# Patient Record
Sex: Female | Born: 1980 | Race: Black or African American | Hispanic: No | Marital: Married | Smoking: Never smoker
Health system: Southern US, Community
[De-identification: ages and names within clinical notes are randomized; demographics above are authoritative.]

## PROBLEM LIST (undated history)

## (undated) DIAGNOSIS — D649 Anemia, unspecified: Secondary | ICD-10-CM

## (undated) DIAGNOSIS — D219 Benign neoplasm of connective and other soft tissue, unspecified: Secondary | ICD-10-CM

## (undated) HISTORY — DX: Anemia, unspecified: D64.9

## (undated) HISTORY — DX: Benign neoplasm of connective and other soft tissue, unspecified: D21.9

---

## 2016-12-14 ENCOUNTER — Encounter: Payer: Self-pay | Admitting: Obstetrics and Gynecology

## 2016-12-14 ENCOUNTER — Ambulatory Visit (INDEPENDENT_AMBULATORY_CARE_PROVIDER_SITE_OTHER): Payer: Medicaid Other | Admitting: Obstetrics and Gynecology

## 2016-12-14 VITALS — BP 119/75 | HR 103 | Ht 63.0 in | Wt 172.4 lb

## 2016-12-14 DIAGNOSIS — Z3009 Encounter for other general counseling and advice on contraception: Secondary | ICD-10-CM

## 2016-12-14 NOTE — Progress Notes (Signed)
HPI:      [name] is a 36 y.o. [phone] who LMP was Patient's last menstrual period was 12/08/2016 (exact date).  Subjective:   She presents today Because she has just moved to this area and is about to run out of OCPs. She is also on a generic birth control pill that she is unhappy with. She states that it has decreased her sex drive and she says she just "doesn't feel right"on them.    Hx: The following portions of the patient's history were reviewed and updated as appropriate:              She  has a past medical history of Fibroid. She  does not have a problem list on file. She  has a past surgical history that includes Cesarean section. Her family history includes Diabetes in her mother. She  reports that she has never smoked. She has never used smokeless tobacco. She reports that she drinks alcohol. She reports that she does not use drugs. No current outpatient prescriptions on file prior to visit.   No current facility-administered medications on file prior to visit.          Review of Systems:  Review of Systems  Constitutional: Denied constitutional symptoms, night sweats, recent illness, fatigue, fever, insomnia and weight loss.  Eyes: Denied eye symptoms, eye pain, photophobia, vision change and visual disturbance.  Ears/Nose/Throat/Neck: Denied ear, nose, throat or neck symptoms, hearing loss, nasal discharge, sinus congestion and sore throat.  Cardiovascular: Denied cardiovascular symptoms, arrhythmia, chest pain/pressure, edema, exercise intolerance, orthopnea and palpitations.  Respiratory: Denied pulmonary symptoms, asthma, pleuritic pain, productive sputum, cough, dyspnea and wheezing.  Gastrointestinal: Denied, gastro-esophageal reflux, melena, nausea and vomiting.  Genitourinary: Denied genitourinary symptoms including symptomatic vaginal discharge, pelvic relaxation issues, and urinary complaints.  Musculoskeletal: Denied musculoskeletal symptoms,  stiffness, swelling, muscle weakness and myalgia.  Dermatologic: Denied dermatology symptoms, rash and scar.  Neurologic: Denied neurology symptoms, dizziness, headache, neck pain and syncope.  Psychiatric: Denied psychiatric symptoms, anxiety and depression.  Endocrine: Denied endocrine symptoms including hot flashes and night sweats.   Meds:   No current outpatient prescriptions on file prior to visit.   No current facility-administered medications on file prior to visit.     Objective:     Vitals:   12/14/16 1536  BP: 119/75  Pulse: (!) 103                Assessment:    [id number] There are no active problems to display for this patient.    1. Birth control counseling     We have discussed multiple forms of birth control pills including both estrogenic and progestogen pills and advantages and disadvantages of each. She would like to try a generic form of 1/20 Fe pill.   Plan:            1.  Change OCPs to generic form of 1/20 pill.  2.  Return for annual exam and Pap smear within 2 weeks.   F/U  Return in about 2 weeks (around 12/28/2016). I spent 22 minutes with this patient of which greater than 50% was spent discussing different types of OCPs, the advantages and disadvantages, necessary annual follow-up for exam and Pap smear.  Finis Bud, M.D. 12/14/2016 4:41 PM

## 2016-12-21 ENCOUNTER — Encounter: Payer: Medicaid Other | Admitting: Obstetrics and Gynecology

## 2016-12-27 ENCOUNTER — Encounter: Payer: Self-pay | Admitting: Obstetrics and Gynecology

## 2016-12-27 ENCOUNTER — Ambulatory Visit (INDEPENDENT_AMBULATORY_CARE_PROVIDER_SITE_OTHER): Payer: Medicaid Other | Admitting: Obstetrics and Gynecology

## 2016-12-27 VITALS — BP 103/73 | HR 89 | Ht 63.0 in | Wt 171.3 lb

## 2016-12-27 DIAGNOSIS — Z0000 Encounter for general adult medical examination without abnormal findings: Secondary | ICD-10-CM | POA: Diagnosis not present

## 2016-12-27 MED ORDER — NORETHINDRONE ACET-ETHINYL EST 1-20 MG-MCG PO TABS: 1.0000 | ORAL_TABLET | Freq: Every day | ORAL | 11 refills | Status: DC

## 2016-12-27 NOTE — Progress Notes (Signed)
HPI:      [name] is a 36 y.o. [phone] who LMP was Patient's last menstrual period was 12/08/2016 (exact date).  Subjective:   She presents today for her annual examination.  She has started her new pills but is not sure if they're working to change her sex drive yet.    Hx: The following portions of the patient's history were reviewed and updated as appropriate:              She  has a past medical history of Fibroid. She  does not have a problem list on file. She  has a past surgical history that includes Cesarean section. Her family history includes Diabetes in her mother. She  reports that she has never smoked. She has never used smokeless tobacco. She reports that she drinks alcohol. She reports that she does not use drugs. No current outpatient prescriptions on file prior to visit.   No current facility-administered medications on file prior to visit.          Review of Systems:  Review of Systems  Constitutional: Denied constitutional symptoms, night sweats, recent illness, fatigue, fever, insomnia and weight loss.  Eyes: Denied eye symptoms, eye pain, photophobia, vision change and visual disturbance.  Ears/Nose/Throat/Neck: Denied ear, nose, throat or neck symptoms, hearing loss, nasal discharge, sinus congestion and sore throat.  Cardiovascular: Denied cardiovascular symptoms, arrhythmia, chest pain/pressure, edema, exercise intolerance, orthopnea and palpitations.  Respiratory: Denied pulmonary symptoms, asthma, pleuritic pain, productive sputum, cough, dyspnea and wheezing.  Gastrointestinal: Denied, gastro-esophageal reflux, melena, nausea and vomiting.  Genitourinary: Denied genitourinary symptoms including symptomatic vaginal discharge, pelvic relaxation issues, and urinary complaints.  Musculoskeletal: Denied musculoskeletal symptoms, stiffness, swelling, muscle weakness and myalgia.  Dermatologic: Denied dermatology symptoms, rash and scar.  Neurologic:  Denied neurology symptoms, dizziness, headache, neck pain and syncope.  Psychiatric: Denied psychiatric symptoms, anxiety and depression.  Endocrine: Denied endocrine symptoms including hot flashes and night sweats.   Meds:   No current outpatient prescriptions on file prior to visit.   No current facility-administered medications on file prior to visit.     Objective:     Vitals:   12/27/16 0810  BP: 103/73  Pulse: 89              Physical examination General NAD, Conversant  HEENT Atraumatic; Op clear with mmm.  Normo-cephalic. Pupils reactive. Anicteric sclerae  Thyroid/Neck Smooth without nodularity or enlargement. Normal ROM.  Neck Supple.  Skin No rashes, lesions or ulceration. Normal palpated skin turgor. No nodularity.  Breasts: No masses or discharge.  Symmetric.  No axillary adenopathy.  Lungs: Clear to auscultation.No rales or wheezes. Normal Respiratory effort, no retractions.  Heart: NSR.  No murmurs or rubs appreciated. No periferal edema  Abdomen: Soft.  Non-tender.  No masses.  No HSM. No hernia  Extremities: Moves all appropriately.  Normal ROM for age. No lymphadenopathy.  Neuro: Oriented to PPT.  Normal mood. Normal affect.     Pelvic:   Vulva: Normal appearance.  No lesions.  Vagina: No lesions or abnormalities noted.  Support: Normal pelvic support.  Urethra No masses tenderness or scarring.  Meatus Normal size without lesions or prolapse.  Cervix: Normal appearance.  No lesions. Located very far anterior.   Anus: Normal exam.  No lesions.  Perineum: Normal exam.  No lesions.        Bimanual   Uterus: Normal size.  Non-tender.  Mobile.  AV.  Mid position small  Adnexae: No masses.  Non-tender to palpation.  Cul-de-sac: Negative for abnormality.      Assessment:    [id number] There are no active problems to display for this patient.    1. Encounter for annual physical exam     Normal exam, OCPs without problem   Plan:            1.   Basic Screening Recommendations The basic screening recommendations for asymptomatic women were discussed with the patient during her visit.  The age-appropriate recommendations were discussed with her and the rational for the tests reviewed.  When I am informed by the patient that another primary care physician has previously obtained the age-appropriate tests and they are up-to-date, only outstanding tests are ordered and referrals given as necessary.  Abnormal results of tests will be discussed with her when all of her results are completed. Pap performed     Meds ordered this encounter  Medications  . DISCONTD: norgestrel-ethinyl estradiol (LO/OVRAL,CRYSELLE) 0.3-30 MG-MCG tablet    Sig: Take 1 tablet by mouth daily.  . norethindrone-ethinyl estradiol (MICROGESTIN) 1-20 MG-MCG tablet    Sig: Take 1 tablet by mouth daily.    Dispense:  1 Package    Refill:  11        F/U  Return in about 1 year (around 12/27/2017) for Annual Physical.  Finis Bud, M.D. 12/27/2016 8:47 AM

## 2016-12-27 NOTE — Addendum Note (Signed)
Addended by: Raliegh Ip on: 12/27/2016 09:01 AM   Modules accepted: Orders

## 2016-12-29 ENCOUNTER — Telehealth: Payer: Self-pay

## 2016-12-29 LAB — PAP IG AND HPV HIGH-RISK
HPV, HIGH-RISK: NEGATIVE
PAP Smear Comment: 0

## 2016-12-29 NOTE — Telephone Encounter (Signed)
-----   Message from Harlin Heys, MD sent at 12/29/2016  2:53 PM EDT ----- Negative Pap and HPV

## 2016-12-29 NOTE — Telephone Encounter (Signed)
Informed pt of neg test results per provider.

## 2017-04-22 ENCOUNTER — Telehealth: Payer: Self-pay | Admitting: Obstetrics and Gynecology

## 2017-04-22 NOTE — Telephone Encounter (Signed)
Patient called back to speak with you, informed you were at lunch. She would like a call back. Thanks

## 2017-04-22 NOTE — Telephone Encounter (Signed)
Pt states she has been bleeding since you saw her in March and prescribed Junell for birth control. States she has used "3 or 4 " packets. States this bleeding is very inconvenient and she would like to have the pill she has taken in the past- microgestin C 1/20 and she would like that sent to the pharmacy before Monday 04/25/17. Please advise. Thanks

## 2017-04-22 NOTE — Telephone Encounter (Signed)
Patient called to speak with Dr. Amalia Hailey. She is concerned because her birth control is not working, and she would like another/different prescription before Monday. Patient was informed that she will get a call back within 24 hrs. Patient did not disclose any other information.

## 2017-04-22 NOTE — Telephone Encounter (Signed)
Attempted to return pts call- unable to leave message states pt is unavailable please try your call later.

## 2017-04-25 NOTE — Telephone Encounter (Signed)
Scheduled patient for 04/26/2017 @ 0800am per Ivin Booty to schedule patient.

## 2017-04-26 ENCOUNTER — Encounter: Payer: Medicaid Other | Admitting: Obstetrics and Gynecology

## 2017-04-27 ENCOUNTER — Ambulatory Visit (INDEPENDENT_AMBULATORY_CARE_PROVIDER_SITE_OTHER): Payer: Medicaid Other | Admitting: Obstetrics and Gynecology

## 2017-04-27 VITALS — BP 105/70 | HR 89 | Ht 63.0 in | Wt 170.1 lb

## 2017-04-27 DIAGNOSIS — Z789 Other specified health status: Secondary | ICD-10-CM

## 2017-04-27 DIAGNOSIS — Z3041 Encounter for surveillance of contraceptive pills: Secondary | ICD-10-CM | POA: Diagnosis not present

## 2017-04-28 ENCOUNTER — Telehealth: Payer: Self-pay

## 2017-04-28 ENCOUNTER — Encounter: Payer: Self-pay | Admitting: Obstetrics and Gynecology

## 2017-04-28 NOTE — Progress Notes (Signed)
HPI:      [name] is a 36 y.o. [phone] who LMP was No LMP recorded.  Subjective:   She presents today With complaints regarding her birth control pills. We initially changed her OCPs at her request to the exact formulation she requested. Unfortunately her pharmacy gave her a generic. This was explained to her she called all pharmacies and the area and none of them have the exact pill she desires. She believes that the generic form of the pill is causing her problems which include breakthrough bleeding and occasional diarrhea. She is requesting a change in OCPs and requesting that before she leaves the office she has a chance to research them call the pharmacy's in the area and find out who carries them and make sure they are acceptable to her.    Hx: The following portions of the patient's history were reviewed and updated as appropriate:             She  has a past medical history of Fibroid. She  does not have a problem list on file. She  has a past surgical history that includes Cesarean section. Her family history includes Diabetes in her mother. She  reports that she has never smoked. She has never used smokeless tobacco. She reports that she drinks alcohol. She reports that she does not use drugs. She has no allergies on file.       Review of Systems:  Review of Systems  Constitutional: Denied constitutional symptoms, night sweats, recent illness, fatigue, fever, insomnia and weight loss.  Eyes: Denied eye symptoms, eye pain, photophobia, vision change and visual disturbance.  Ears/Nose/Throat/Neck: Denied ear, nose, throat or neck symptoms, hearing loss, nasal discharge, sinus congestion and sore throat.  Cardiovascular: Denied cardiovascular symptoms, arrhythmia, chest pain/pressure, edema, exercise intolerance, orthopnea and palpitations.  Respiratory: Denied pulmonary symptoms, asthma, pleuritic pain, productive sputum, cough, dyspnea and wheezing.  Gastrointestinal:  Denied, gastro-esophageal reflux, melena, nausea and vomiting.  Genitourinary: Denied genitourinary symptoms including symptomatic vaginal discharge, pelvic relaxation issues, and urinary complaints.  Musculoskeletal: Denied musculoskeletal symptoms, stiffness, swelling, muscle weakness and myalgia.  Dermatologic: Denied dermatology symptoms, rash and scar.  Neurologic: Denied neurology symptoms, dizziness, headache, neck pain and syncope.  Psychiatric: Denied psychiatric symptoms, anxiety and depression.  Endocrine: Denied endocrine symptoms including hot flashes and night sweats.   Meds:   No current outpatient prescriptions on file prior to visit.   No current facility-administered medications on file prior to visit.     Objective:     There were no vitals filed for this visit.              Assessment:    [id number] There are no active problems to display for this patient.    1. Oral contraceptive intolerance     Patient has numerous complaints regarding her generic form of OCP. She believes it is causing her numerous side effect type problems. We have discussed these problems in detail and have agreed to change her to a different formulation to try to solve her bleeding problems.   Plan:            1.  I have suggested and explained the difference in multiple different brands and formulations of birth control pills. We have decided on a progesterone dominant pill-Loestrin 1.530 patient was instructed very specifically on how to begin the pills. She looked them up on her phone and said they were acceptable to her.  Because our computers were down  we have called them in to her pharmacy directly. Orders No orders of the defined types were placed in this encounter.   No orders of the defined types were placed in this encounter.       F/U  Return for As scheduled. I spent 18 minutes with this patient of which greater than 50% was spent discussing OCPs  Finis Bud,  M.D. 04/28/2017 9:16 AM  ADDEND: Patient has previously been made aware and was made aware again today of the contraindication of OCPs and tobacco use. If she has not discontinued cigarettes at the expiration of her next 3 months of OCPs we will not continue or restart them.

## 2017-04-28 NOTE — Telephone Encounter (Signed)
After speaking with provider - the conversation the pt had with the provider on 04/27/17 the pt requested OCPs to control menstrual cycle. The Fe in the OCP is to help replace blood loss during menses. Explained to pt OrthoTri Cyclen will not help with control. Pt was given option of not taking the Fe but making sure she counted out the days before starting a new pack. Pt expressed understanding and will pick up script that was phoned in 04/27/17.

## 2017-11-22 DIAGNOSIS — Z30013 Encounter for initial prescription of injectable contraceptive: Secondary | ICD-10-CM | POA: Diagnosis not present

## 2017-11-22 DIAGNOSIS — Z309 Encounter for contraceptive management, unspecified: Secondary | ICD-10-CM | POA: Diagnosis not present

## 2018-02-09 DIAGNOSIS — Z30013 Encounter for initial prescription of injectable contraceptive: Secondary | ICD-10-CM | POA: Diagnosis not present

## 2018-02-09 DIAGNOSIS — Z309 Encounter for contraceptive management, unspecified: Secondary | ICD-10-CM | POA: Diagnosis not present

## 2018-03-10 ENCOUNTER — Ambulatory Visit (INDEPENDENT_AMBULATORY_CARE_PROVIDER_SITE_OTHER): Payer: 59 | Admitting: Physician Assistant

## 2018-03-10 ENCOUNTER — Encounter: Payer: Self-pay | Admitting: Physician Assistant

## 2018-03-10 VITALS — BP 108/66 | Temp 98.6°F | Resp 16 | Ht 63.0 in | Wt 190.0 lb

## 2018-03-10 DIAGNOSIS — Z130 Encounter for screening for diseases of the blood and blood-forming organs and certain disorders involving the immune mechanism: Secondary | ICD-10-CM

## 2018-03-10 DIAGNOSIS — Z1329 Encounter for screening for other suspected endocrine disorder: Secondary | ICD-10-CM | POA: Diagnosis not present

## 2018-03-10 DIAGNOSIS — K2970 Gastritis, unspecified, without bleeding: Secondary | ICD-10-CM | POA: Insufficient documentation

## 2018-03-10 DIAGNOSIS — Z0000 Encounter for general adult medical examination without abnormal findings: Secondary | ICD-10-CM

## 2018-03-10 DIAGNOSIS — Z13220 Encounter for screening for lipoid disorders: Secondary | ICD-10-CM | POA: Diagnosis not present

## 2018-03-10 DIAGNOSIS — Z86018 Personal history of other benign neoplasm: Secondary | ICD-10-CM

## 2018-03-10 DIAGNOSIS — H43393 Other vitreous opacities, bilateral: Secondary | ICD-10-CM

## 2018-03-10 DIAGNOSIS — Z114 Encounter for screening for human immunodeficiency virus [HIV]: Secondary | ICD-10-CM | POA: Diagnosis not present

## 2018-03-10 DIAGNOSIS — Z13228 Encounter for screening for other metabolic disorders: Secondary | ICD-10-CM | POA: Diagnosis not present

## 2018-03-10 DIAGNOSIS — D649 Anemia, unspecified: Secondary | ICD-10-CM | POA: Diagnosis not present

## 2018-03-10 LAB — POCT URINALYSIS DIP (MANUAL ENTRY)
Bilirubin, UA: NEGATIVE
Glucose, UA: NEGATIVE mg/dL
Leukocytes, UA: NEGATIVE
Nitrite, UA: NEGATIVE
Protein Ur, POC: NEGATIVE mg/dL
Spec Grav, UA: >=1.03 — AB (ref 1.010–1.025)
Urobilinogen, UA: 0.2 U/dL
pH, UA: 5.5 (ref 5.0–8.0)

## 2018-03-10 NOTE — Patient Instructions (Addendum)
You will get a phone call to schedule an appointment with an eye doctor and with GYN.   Please make an appointment to have your routine teeth cleaning.    Health Maintenance, Female Adopting a healthy lifestyle and getting preventive care can go a long way to promote health and wellness. Talk with your health care provider about what schedule of regular examinations is right for you. This is a good chance for you to check in with your provider about disease prevention and staying healthy. In between checkups, there are plenty of things you can do on your own. Experts have done a lot of research about which lifestyle changes and preventive measures are most likely to keep you healthy. Ask your health care provider for more information. Weight and diet Eat a healthy diet  Be sure to include plenty of vegetables, fruits, low-fat dairy products, and lean protein.  Do not eat a lot of foods high in solid fats, added sugars, or salt.  Get regular exercise. This is one of the most important things you can do for your health. ? Most adults should exercise for at least 150 minutes each week. The exercise should increase your heart rate and make you sweat (moderate-intensity exercise). ? Most adults should also do strengthening exercises at least twice a week. This is in addition to the moderate-intensity exercise.  Maintain a healthy weight  Body mass index (BMI) is a measurement that can be used to identify possible weight problems. It estimates body fat based on height and weight. Your health care provider can help determine your BMI and help you achieve or maintain a healthy weight.  For females 31 years of age and older: ? A BMI below 18.5 is considered underweight. ? A BMI of 18.5 to 24.9 is normal. ? A BMI of 25 to 29.9 is considered overweight. ? A BMI of 30 and above is considered obese.  Watch levels of cholesterol and blood lipids  You should start having your blood tested for  lipids and cholesterol at 37 years of age, then have this test every 5 years.  You may need to have your cholesterol levels checked more often if: ? Your lipid or cholesterol levels are high. ? You are older than 37 years of age. ? You are at high risk for heart disease.  Cancer screening Lung Cancer  Lung cancer screening is recommended for adults 11-70 years old who are at high risk for lung cancer because of a history of smoking.  A yearly low-dose CT scan of the lungs is recommended for people who: ? Currently smoke. ? Have quit within the past 15 years. ? Have at least a 30-pack-year history of smoking. A pack year is smoking an average of one pack of cigarettes a day for 1 year.  Yearly screening should continue until it has been 15 years since you quit.  Yearly screening should stop if you develop a health problem that would prevent you from having lung cancer treatment.  Breast Cancer  Practice breast self-awareness. This means understanding how your breasts normally appear and feel.  It also means doing regular breast self-exams. Let your health care provider know about any changes, no matter how small.  If you are in your 20s or 30s, you should have a clinical breast exam (CBE) by a health care provider every 1-3 years as part of a regular health exam.  If you are 93 or older, have a CBE every year. Also consider having  a breast X-ray (mammogram) every year.  If you have a family history of breast cancer, talk to your health care provider about genetic screening.  If you are at high risk for breast cancer, talk to your health care provider about having an MRI and a mammogram every year.  Breast cancer gene (BRCA) assessment is recommended for women who have family members with BRCA-related cancers. BRCA-related cancers include: ? Breast. ? Ovarian. ? Tubal. ? Peritoneal cancers.  Results of the assessment will determine the need for genetic counseling and BRCA1 and  BRCA2 testing.  Cervical Cancer Your health care provider may recommend that you be screened regularly for cancer of the pelvic organs (ovaries, uterus, and vagina). This screening involves a pelvic examination, including checking for microscopic changes to the surface of your cervix (Pap test). You may be encouraged to have this screening done every 3 years, beginning at age 7.  For women ages 24-65, health care providers may recommend pelvic exams and Pap testing every 3 years, or they may recommend the Pap and pelvic exam, combined with testing for human papilloma virus (HPV), every 5 years. Some types of HPV increase your risk of cervical cancer. Testing for HPV may also be done on women of any age with unclear Pap test results.  Other health care providers may not recommend any screening for nonpregnant women who are considered low risk for pelvic cancer and who do not have symptoms. Ask your health care provider if a screening pelvic exam is right for you.  If you have had past treatment for cervical cancer or a condition that could lead to cancer, you need Pap tests and screening for cancer for at least 20 years after your treatment. If Pap tests have been discontinued, your risk factors (such as having a new sexual partner) need to be reassessed to determine if screening should resume. Some women have medical problems that increase the chance of getting cervical cancer. In these cases, your health care provider may recommend more frequent screening and Pap tests.  Colorectal Cancer  This type of cancer can be detected and often prevented.  Routine colorectal cancer screening usually begins at 37 years of age and continues through 37 years of age.  Your health care provider may recommend screening at an earlier age if you have risk factors for colon cancer.  Your health care provider may also recommend using home test kits to check for hidden blood in the stool.  A small camera at the  end of a tube can be used to examine your colon directly (sigmoidoscopy or colonoscopy). This is done to check for the earliest forms of colorectal cancer.  Routine screening usually begins at age 36.  Direct examination of the colon should be repeated every 5-10 years through 37 years of age. However, you may need to be screened more often if early forms of precancerous polyps or small growths are found.  Skin Cancer  Check your skin from head to toe regularly.  Tell your health care provider about any new moles or changes in moles, especially if there is a change in a mole's shape or color.  Also tell your health care provider if you have a mole that is larger than the size of a pencil eraser.  Always use sunscreen. Apply sunscreen liberally and repeatedly throughout the day.  Protect yourself by wearing long sleeves, pants, a wide-brimmed hat, and sunglasses whenever you are outside.  Heart disease, diabetes, and high blood pressure  High blood pressure causes heart disease and increases the risk of stroke. High blood pressure is more likely to develop in: ? People who have blood pressure in the high end of the normal range (130-139/85-89 mm Hg). ? People who are overweight or obese. ? People who are African American.  If you are 32-81 years of age, have your blood pressure checked every 3-5 years. If you are 37 years of age or older, have your blood pressure checked every year. You should have your blood pressure measured twice-once when you are at a hospital or clinic, and once when you are not at a hospital or clinic. Record the average of the two measurements. To check your blood pressure when you are not at a hospital or clinic, you can use: ? An automated blood pressure machine at a pharmacy. ? A home blood pressure monitor.  If you are between 27 years and 26 years old, ask your health care provider if you should take aspirin to prevent strokes.  Have regular diabetes  screenings. This involves taking a blood sample to check your fasting blood sugar level. ? If you are at a normal weight and have a low risk for diabetes, have this test once every three years after 37 years of age. ? If you are overweight and have a high risk for diabetes, consider being tested at a younger age or more often. Preventing infection Hepatitis B  If you have a higher risk for hepatitis B, you should be screened for this virus. You are considered at high risk for hepatitis B if: ? You were born in a country where hepatitis B is common. Ask your health care provider which countries are considered high risk. ? Your parents were born in a high-risk country, and you have not been immunized against hepatitis B (hepatitis B vaccine). ? You have HIV or AIDS. ? You use needles to inject street drugs. ? You live with someone who has hepatitis B. ? You have had sex with someone who has hepatitis B. ? You get hemodialysis treatment. ? You take certain medicines for conditions, including cancer, organ transplantation, and autoimmune conditions.  Hepatitis C  Blood testing is recommended for: ? Everyone born from [birth year] through 1965. ? Anyone with known risk factors for hepatitis C.  Sexually transmitted infections (STIs)  You should be screened for sexually transmitted infections (STIs) including gonorrhea and chlamydia if: ? You are sexually active and are younger than 37 years of age. ? You are older than 37 years of age and your health care provider tells you that you are at risk for this type of infection. ? Your sexual activity has changed since you were last screened and you are at an increased risk for chlamydia or gonorrhea. Ask your health care provider if you are at risk.  If you do not have HIV, but are at risk, it may be recommended that you take a prescription medicine daily to prevent HIV infection. This is called pre-exposure prophylaxis (PrEP). You are considered at risk  if: ? You are sexually active and do not regularly use condoms or know the HIV status of your partner(s). ? You take drugs by injection. ? You are sexually active with a partner who has HIV.  Talk with your health care provider about whether you are at high risk of being infected with HIV. If you choose to begin PrEP, you should first be tested for HIV. You should then be tested every 3 months  for as long as you are taking PrEP. Pregnancy  If you are premenopausal and you may become pregnant, ask your health care provider about preconception counseling.  If you may become pregnant, take 400 to 800 micrograms (mcg) of folic acid every day.  If you want to prevent pregnancy, talk to your health care provider about birth control (contraception). Osteoporosis and menopause  Osteoporosis is a disease in which the bones lose minerals and strength with aging. This can result in serious bone fractures. Your risk for osteoporosis can be identified using a bone density scan.  If you are 35 years of age or older, or if you are at risk for osteoporosis and fractures, ask your health care provider if you should be screened.  Ask your health care provider whether you should take a calcium or vitamin D supplement to lower your risk for osteoporosis.  Menopause may have certain physical symptoms and risks.  Hormone replacement therapy may reduce some of these symptoms and risks. Talk to your health care provider about whether hormone replacement therapy is right for you. Follow these instructions at home:  Schedule regular health, dental, and eye exams.  Stay current with your immunizations.  Do not use any tobacco products including cigarettes, chewing tobacco, or electronic cigarettes.  If you are pregnant, do not drink alcohol.  If you are breastfeeding, limit how much and how often you drink alcohol.  Limit alcohol intake to no more than 1 drink per day for nonpregnant women. One drink  equals 12 ounces of beer, 5 ounces of wine, or 1 ounces of hard liquor.  Do not use street drugs.  Do not share needles.  Ask your health care provider for help if you need support or information about quitting drugs.  Tell your health care provider if you often feel depressed.  Tell your health care provider if you have ever been abused or do not feel safe at home. This information is not intended to replace advice given to you by your health care provider. Make sure you discuss any questions you have with your health care provider. Document Released: 03/22/2011 Document Revised: 02/12/2016 Document Reviewed: 06/10/2015 Elsevier Interactive Patient Education  [employer].  Thank you for coming in today. I hope you feel we met your needs.  Feel free to call PCP if you have any questions or further requests.  Please consider signing up for MyChart if you do not already have it, as this is a great way to communicate with me.  Best,  Whitney McVey, PA-C   IF you received an x-ray today, you will receive an invoice from [facility]. Please contact [facility] at [phone] with questions or concerns regarding your invoice.   IF you received labwork today, you will receive an invoice from [facility]. Please contact [facility] at [phone] with questions or concerns regarding your invoice.   Our billing staff will not be able to assist you with questions regarding bills from these companies.  You will be contacted with the lab results as soon as they are available. The fastest way to get your results is to activate your My Chart account. Instructions are located on the last page of this paperwork. If you have not heard from [place] regarding the results in 2 weeks, please contact this office.

## 2018-03-10 NOTE — Progress Notes (Signed)
[facility] [place], [place] [address] [phone]  Date:  03/10/2018   Name:  Madeline Moore   DOB:  [birth year]   MRN:  [id number]  PCP:  Patient, No Pcp Per    Chief Complaint: Well check   History of Present Illness:  This is a 37 y.o. female with PMH fibroids, anemia who is presenting for CPE. 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 Moved here from [place] a few years ago.   Fibroids - she would like to know the status of her fibroids  Complaints: "I see floaters" x several years.. Denies changes in visual acuity.  LMP: unknown due to depo Contraception: Depo Provera injections Last pap: due 12/28/19. Negative Pap and HPV.  Sexual history: active with one partner. She has two children.  Immunizations: needs tdap.  Dentist: does not see regularly.  Eye: 20/20 b/l  Fam hx: family history includes Diabetes in her mother and sister.  Tobacco/alcohol/substance use: Never Smoker, no alcohol or drug use.    Review of Systems:  Review of Systems  Constitutional: Negative for chills, diaphoresis, fatigue and fever.  HENT: Negative for congestion, postnasal drip, rhinorrhea, sinus pressure, sneezing and sore throat.   Respiratory: Negative for cough, chest tightness, shortness of breath and wheezing.   Cardiovascular: Negative for chest pain and palpitations.  Gastrointestinal: Negative for abdominal pain, diarrhea, nausea and vomiting.  Genitourinary: Negative for decreased urine volume, difficulty urinating, dysuria, enuresis, flank pain, frequency, hematuria and urgency.  Musculoskeletal: Negative for back pain.  Neurological: Negative for dizziness, weakness, light-headedness and headaches.    There are no active problems to display for this patient.   Prior to Admission medications   Medication Sig Start Date End Date Taking? Authorizing Provider  ferrous sulfate 325 (65 FE) MG tablet Take 325 mg by mouth daily with breakfast.   Yes [provider]    Allergies  Allergen Reactions  . Doxycycline Nausea And Vomiting    Past Surgical History:  Procedure Laterality Date  . CESAREAN SECTION      Social History   Tobacco Use  . Smoking status: Never Smoker  . Smokeless tobacco: Never Used  Substance Use Topics  . Alcohol use: Not Currently    Comment: occas  . Drug use: No    Family History  Problem Relation Age of Onset  . Diabetes Mother   . Diabetes Sister     Medication list has been reviewed and updated.  Physical Examination:  Physical Exam  Constitutional: She is oriented to person, place, and time. No distress.  Eyes: Pupils are equal, round, and reactive to light. Conjunctivae and EOM are normal.  Neck: Normal range of motion. Neck supple. No thyromegaly present.  Cardiovascular: Normal rate, regular rhythm and normal heart sounds.  Pulmonary/Chest: Effort normal and breath sounds normal. She has no wheezes. She has no rales.  Abdominal: Soft. Bowel sounds are normal. She exhibits no mass. There is no tenderness.  Neurological: She is alert and oriented to person, place, and time.  Skin: Skin is warm and dry.  Psychiatric: Judgment normal.  Vitals reviewed.   BP 108/66 (BP Location: Left Arm, Patient Position: Sitting, Cuff Size: Large)   Temp 98.6 F (37 C) (Oral)   Resp 16   Ht _0  (1.6 m)   Wt 190 lb (86.2 kg)   SpO2 95%   BMI 33.66 kg/m   Assessment and Plan: 1. Annual physical exam -Patient presents for  her first annual physical exam in several years.  She is feeling well today.  Plan to refer to ophthalmology as she complains of "seeing floaters" for several years.  She has a history of uterine fibroids, plan to refer to GYN for follow-up.  Last Pap was on 4\9\2018 which was negative for HPV.  Routine labs are pending.  Health maintenance discussed and printed out for patient  2. Screening, lipid - Lipid panel  3. Screening for endocrine, metabolic and immunity  disorder - CBC with Differential/Platelet - CMP14+EGFR - POCT urinalysis dipstick  4. Screening for HIV (human immunodeficiency virus) - HIV antibody  5. Anemia, unspecified type - Iron, TIBC and Ferritin Panel  6. Vitreous floaters of both eyes - Ambulatory referral to Ophthalmology  7. History of uterine fibroid - Ambulatory referral to Gynecology   Mercer Pod, PA-C  [facility] [place] 03/10/2018 3:37 PM

## 2018-03-11 LAB — CBC WITH DIFFERENTIAL/PLATELET
Basophils Absolute: 0 10*3/uL (ref 0.0–0.2)
Basos: 1 %
EOS (ABSOLUTE): 0.1 10*3/uL (ref 0.0–0.4)
Eos: 1 %
Hematocrit: 43 % (ref 34.0–46.6)
Hemoglobin: 13.1 g/dL (ref 11.1–15.9)
Immature Grans (Abs): 0 10*3/uL (ref 0.0–0.1)
Immature Granulocytes: 0 %
Lymphocytes Absolute: 2.1 10*3/uL (ref 0.7–3.1)
Lymphs: 25 %
MCH: 24.3 pg — ABNORMAL LOW (ref 26.6–33.0)
MCHC: 30.5 g/dL — ABNORMAL LOW (ref 31.5–35.7)
MCV: 80 fL (ref 79–97)
Monocytes Absolute: 0.5 10*3/uL (ref 0.1–0.9)
Monocytes: 6 %
Neutrophils Absolute: 5.5 10*3/uL (ref 1.4–7.0)
Neutrophils: 67 %
Platelets: 242 10*3/uL (ref 150–450)
RBC: 5.38 x10E6/uL — ABNORMAL HIGH (ref 3.77–5.28)
RDW: 17 % — ABNORMAL HIGH (ref 12.3–15.4)
WBC: 8.2 10*3/uL (ref 3.4–10.8)

## 2018-03-11 LAB — CMP14+EGFR
ALT: 26 IU/L (ref 0–32)
AST: 15 IU/L (ref 0–40)
Albumin/Globulin Ratio: 1.5 (ref 1.2–2.2)
Albumin: 4.6 g/dL (ref 3.5–5.5)
Alkaline Phosphatase: 102 IU/L (ref 39–117)
BUN/Creatinine Ratio: 12 (ref 9–23)
BUN: 9 mg/dL (ref 6–20)
Bilirubin Total: 0.4 mg/dL (ref 0.0–1.2)
CO2: 20 mmol/L (ref 20–29)
Calcium: 9.7 mg/dL (ref 8.7–10.2)
Chloride: 101 mmol/L (ref 96–106)
Creatinine, Ser: 0.75 mg/dL (ref 0.57–1.00)
GFR calc Af Amer: 119 mL/min/{1.73_m2} (ref >59)
GFR calc non Af Amer: 103 mL/min/{1.73_m2} (ref >59)
Globulin, Total: 3.1 g/dL (ref 1.5–4.5)
Glucose: 85 mg/dL (ref 65–99)
Potassium: 3.6 mmol/L (ref 3.5–5.2)
Sodium: 140 mmol/L (ref 134–144)
Total Protein: 7.7 g/dL (ref 6.0–8.5)

## 2018-03-11 LAB — LIPID PANEL
Chol/HDL Ratio: 5.2 ratio — ABNORMAL HIGH (ref 0.0–4.4)
Cholesterol, Total: 245 mg/dL — ABNORMAL HIGH (ref 100–199)
HDL: 47 mg/dL (ref >39)
LDL Calculated: 175 mg/dL — ABNORMAL HIGH (ref 0–99)
Triglycerides: 117 mg/dL (ref 0–149)
VLDL Cholesterol Cal: 23 mg/dL (ref 5–40)

## 2018-03-11 LAB — IRON,TIBC AND FERRITIN PANEL
Ferritin: 178 ng/mL — ABNORMAL HIGH (ref 15–150)
Iron Saturation: 50 % (ref 15–55)
Iron: 138 ug/dL (ref 27–159)
Total Iron Binding Capacity: 278 ug/dL (ref 250–450)
UIBC: 140 ug/dL (ref 131–425)

## 2018-03-11 LAB — HIV ANTIBODY (ROUTINE TESTING W REFLEX): HIV Screen 4th Generation wRfx: NONREACTIVE

## 2018-03-13 ENCOUNTER — Encounter: Payer: Self-pay | Admitting: Physician Assistant

## 2018-03-15 ENCOUNTER — Telehealth: Payer: Self-pay | Admitting: Obstetrics & Gynecology

## 2018-03-15 NOTE — Telephone Encounter (Signed)
Primary care at [facility] referring for hx of uterine fibroids. Called and spoke with patient to schedule appointment . Patient wishes to call back to be schedule.

## 2018-03-21 NOTE — Telephone Encounter (Signed)
Voicemail box not set up. Unable to leave message for patient to call back to be schedule

## 2018-03-22 NOTE — Telephone Encounter (Signed)
Voicemail box not set up. Unable to leave message for patient to call back to be schedule

## 2018-03-30 NOTE — Telephone Encounter (Signed)
Spoke with spoke to schedule referral. Patient wishes to call back to be schedule

## 2018-05-01 DIAGNOSIS — Z309 Encounter for contraceptive management, unspecified: Secondary | ICD-10-CM | POA: Diagnosis not present

## 2018-05-01 DIAGNOSIS — Z30013 Encounter for initial prescription of injectable contraceptive: Secondary | ICD-10-CM | POA: Diagnosis not present

## 2018-06-05 DIAGNOSIS — H43393 Other vitreous opacities, bilateral: Secondary | ICD-10-CM | POA: Diagnosis not present

## 2018-07-19 DIAGNOSIS — Z01419 Encounter for gynecological examination (general) (routine) without abnormal findings: Secondary | ICD-10-CM | POA: Diagnosis not present

## 2018-12-03 ENCOUNTER — Emergency Department
Admission: EM | Admit: 2018-12-03 | Discharge: 2018-12-03 | Disposition: A | Discharge status: Home / Self Care | Payer: 59 | Attending: Student in an Organized Health Care Education/Training Program | Admitting: Student in an Organized Health Care Education/Training Program

## 2018-12-03 ENCOUNTER — Emergency Department: Payer: 59

## 2018-12-03 ENCOUNTER — Other Ambulatory Visit: Payer: Self-pay

## 2018-12-03 DIAGNOSIS — Z79899 Other long term (current) drug therapy: Secondary | ICD-10-CM | POA: Insufficient documentation

## 2018-12-03 DIAGNOSIS — J189 Pneumonia, unspecified organism: Secondary | ICD-10-CM | POA: Insufficient documentation

## 2018-12-03 DIAGNOSIS — R05 Cough: Secondary | ICD-10-CM | POA: Diagnosis not present

## 2018-12-03 MED ORDER — PSEUDOEPH-BROMPHEN-DM 30-2-10 MG/5ML PO SYRP: 10.0000 mL | ORAL_SOLUTION | Freq: Four times a day (QID) | ORAL | 0 refills | Status: DC | PRN

## 2018-12-03 MED ORDER — FLUTICASONE PROPIONATE 50 MCG/ACT NA SUSP: 1.0000 | Freq: Two times a day (BID) | NASAL | 0 refills | Status: DC

## 2018-12-03 MED ORDER — AZITHROMYCIN 250 MG PO TABS: ORAL_TABLET | ORAL | 0 refills | Status: DC

## 2018-12-03 NOTE — ED Triage Notes (Signed)
Pt states x 3 weeks cough and congestion. A&O, no distress noted. Here with daughter.

## 2018-12-03 NOTE — ED Provider Notes (Signed)
[facility] Emergency Department Provider Note  ____________________________________________  Time seen: Approximately 5:59 PM  I have reviewed the triage vital signs and the nursing notes.   HISTORY  Chief Complaint Cough and Nasal Congestion    HPI Madeline Moore is a 38 y.o. female who presents emergency department complaining of 3-week history of URI symptoms with cough.  Patient has had nasal congestion, scratchy throat, cough x3 weeks.  Patient started off with typical URI but persisted with cough.  No shortness of breath,.  No chest pain.  Patient denies any headache, neck pain, chest pain, domino pain, nausea or vomiting, diarrhea constipation.  No medications for his complaint prior to arrival.         Past Medical History:  Diagnosis Date  . Anemia   . Fibroid     Patient Active Problem List   Diagnosis Date Noted  . Gastritis 03/10/2018    Past Surgical History:  Procedure Laterality Date  . CESAREAN SECTION      Prior to Admission medications   Medication Sig Start Date End Date Taking? Authorizing Provider  azithromycin (ZITHROMAX Z-PAK) 250 MG tablet Take 2 tablets (500 mg) on  Day 1,  followed by 1 tablet (250 mg) once daily on Days 2 through 5. 12/03/18   Charliee Krenz, Roderic Palau D, PA-C  brompheniramine-pseudoephedrine-DM 30-2-10 MG/5ML syrup Take 10 mLs by mouth 4 (four) times daily as needed. 12/03/18   Yee Joss, Charline Bills, PA-C  ferrous sulfate 325 (65 FE) MG tablet Take 325 mg by mouth daily with breakfast.    [provider]  fluticasone (FLONASE) 50 MCG/ACT nasal spray Place 1 spray into both nostrils 2 (two) times daily. 12/03/18   Loney Peto, Charline Bills, PA-C    Allergies Doxycycline  Family History  Problem Relation Age of Onset  . Diabetes Mother   . Diabetes Sister     Social History Social History   Tobacco Use  . Smoking status: Never Smoker  . Smokeless tobacco: Never Used  Substance Use Topics  .  Alcohol use: Not Currently    Comment: occas  . Drug use: No     Review of Systems  Constitutional: No fever/chills Eyes: No visual changes. No discharge ENT: No upper respiratory complaints. Cardiovascular: no chest pain. Respiratory: Positive cough. No SOB. Gastrointestinal: No abdominal pain.  No nausea, no vomiting.  No diarrhea.  No constipation Musculoskeletal: Negative for musculoskeletal pain. Skin: Negative for rash, abrasions, lacerations, ecchymosis. Neurological: Negative for headaches, focal weakness or numbness. 10-point ROS otherwise negative.  ____________________________________________   PHYSICAL EXAM:  VITAL SIGNS: ED Triage Vitals [12/03/18 1746]  Enc Vitals Group     BP 121/82     Pulse Rate 86     Resp 16     Temp 98.4 F (36.9 C)     Temp Source Oral     SpO2 98 %     Weight 180 lb (81.6 kg)     Height 5\' 3"  (1.6 m)     Head Circumference      Peak Flow      Pain Score 0     Pain Loc      Pain Edu?      Excl. in [place]?      Constitutional: Alert and oriented. Well appearing and in no acute distress. Eyes: Conjunctivae are normal. PERRL. EOMI. Head: Atraumatic. ENT:      Ears: EACs and TMs unremarkable bilaterally.      Nose: No congestion/rhinnorhea.  Mouth/Throat: Mucous membranes are moist.  Neck: No stridor.  Neck is supple full range of motion Hematological/Lymphatic/Immunilogical: No cervical lymphadenopathy. Cardiovascular: Normal rate, regular rhythm. Normal S1 and S2.  Good peripheral circulation. Respiratory: Normal respiratory effort without tachypnea or retractions. Lungs with a few scattered coarse breath sounds.  No wheezing, rales, rhonchi.Kermit Balo air entry to the bases with no decreased or absent breath sounds. Musculoskeletal: Full range of motion to all extremities. No gross deformities appreciated. Neurologic:  Normal speech and language. No gross focal neurologic deficits are appreciated.  Skin:  Skin is warm, dry and  intact. No rash noted. Psychiatric: Mood and affect are normal. Speech and behavior are normal. Patient exhibits appropriate insight and judgement.   ____________________________________________   LABS (all labs ordered are listed, but only abnormal results are displayed)  Labs Reviewed - No data to display ____________________________________________  EKG   ____________________________________________  RADIOLOGY I personally viewed and evaluated these images as part of my medical decision making, as well as reviewing the written report by the radiologist.  Dg Chest 2 View  Result Date: 12/03/2018 CLINICAL DATA:  Productive cough and chest congestion for the past 3 weeks. EXAM: CHEST - 2 VIEW COMPARISON:  None. FINDINGS: Normal sized heart. Clear lungs. Minimal thoracic spine degenerative changes. IMPRESSION: No acute abnormality. Electronically Signed   By: Claudie Revering M.D.   On: 12/03/2018 18:36    ____________________________________________    PROCEDURES  Procedure(s) performed:    Procedures    Medications - No data to display   ____________________________________________   INITIAL IMPRESSION / ASSESSMENT AND PLAN / ED COURSE  Pertinent labs & imaging results that were available during my care of the patient were reviewed by me and considered in my medical decision making (see chart for details).  Review of the [place] CSRS was performed in accordance of the [place] prior to dispensing any controlled drugs.           Patient's diagnosis is consistent with community-acquired pneumonia.  Patient presented to the emergency department with 3-week history of ongoing cough, URI symptoms.  Chest x-ray reveals no consolidation concerning for lobar pneumonia.  Given length of symptoms I will treat the patient with azithromycin for community-acquired/mycoplasma pneumonia.  Patient will be prescribed Bromfed cough syrup and Flonase for additional symptom control.  Follow-up  with primary care as needed..  Patient is given ED precautions to return to the ED for any worsening or new symptoms.     ____________________________________________  FINAL CLINICAL IMPRESSION(S) / ED DIAGNOSES  Final diagnoses:  Community acquired pneumonia, unspecified laterality      NEW MEDICATIONS STARTED DURING THIS VISIT:  ED Discharge Orders         Ordered    azithromycin (ZITHROMAX Z-PAK) 250 MG tablet     12/03/18 1920    brompheniramine-pseudoephedrine-DM 30-2-10 MG/5ML syrup  4 times daily PRN     12/03/18 1920    fluticasone (FLONASE) 50 MCG/ACT nasal spray  2 times daily     12/03/18 1920              This chart was dictated using voice recognition software/Dragon. Despite best efforts to proofread, errors can occur which can change the meaning. Any change was purely unintentional.    Darletta Moll, PA-C 12/03/18 1941    Merlyn Lot, MD 12/03/18 2001

## 2019-01-26 DIAGNOSIS — L815 Leukoderma, not elsewhere classified: Secondary | ICD-10-CM | POA: Diagnosis not present

## 2019-01-26 DIAGNOSIS — L700 Acne vulgaris: Secondary | ICD-10-CM | POA: Diagnosis not present

## 2019-05-15 ENCOUNTER — Ambulatory Visit: Payer: Self-pay | Admitting: *Deleted

## 2019-05-15 NOTE — Telephone Encounter (Signed)
Reason for Disposition . [1] Purple or blood-colored LOCALIZED rash AND [2] no fever AND [3] sounds well to triager  (Exception: bruise from injury or friction)  Answer Assessment - Initial Assessment Questions 1. APPEARANCE of RASH: "Describe the rash. What color is it?" (Note: It is difficult to assess rash color in people with darker-colored skin. When this situation occurs, simply ask the caller to describe what they see.)    Red dots  2. SIZE: "How big are the spots?"     Very small 3. LOCATION: "Where is the rash located?"     Right and left shoulder, but have been located on other parts of body previously  4. ONSET: "When did the rash begin?"     4 months ago intermittnetly 5. FEVER: "Do you have a fever?" If so, ask: "What is your temperature, how was it measured, and when did it start?"     No fever  6. CAUSE: "What do you think is causing the rash?"     Unsure  7. MEDICAL HISTORY: "Do you have any medical problems that can cause easy bruising or bleeding?" (e.g., leukemia, liver disease, recent chemotherapy)     Denies all   8. MEDICATIONS: "Do you take any medications which thin the blood such as: aspirin, heparin, ibuprofen (NSAIDS), Plavix, or Coumadin?"     Took ibuprofen today, had not taking any of the above medications previously 9. OTHER SYMPTOMS: "Do you have any other symptoms?" (e.g., headache, dizziness, vomiting, sore throat, joint pain, bleeding)     Denies all  10. PREGNANCY: "Is there any chance you are pregnant?" "When was your last menstrual period?"  Answer Assessment - Initial Assessment Questions 1. APPEARANCE of BRUISE: "Describe the bruise."       2. SIZE: "How large is the bruise?"  Size of a quarter 3. NUMBER: "How many bruises are there?"      One bruise  4. LOCATION: "Where is the bruise located?"     Lower right arm  5. ONSET: "How long ago did the bruise occur?"      Noticed bruise yesterday 6. CAUSE: "Tell me how it happened." Does not  recall an injury       7. MEDICAL HISTORY: "Do you have any medical problems that can cause easy bruising or bleeding?" (e.g., leukemia, liver disease, recent chemotherapy)    Denies  8. MEDICATIONS : "Do you take any medications which thin the blood such as: aspirin, heparin, ibuprofen (NSAIDS), Plavix, or Coumadin?"    Took ibuprofen today, had not taken any ibuprofen recently before today.  Denies other medications  9. OTHER SYMPTOMS: "Do you have any other symptoms?"  (e.g., weakness, dizziness, pain, fever, nosebleed, blood in urine/stool)     Denies all  10. PREGNANCY: "Is there any chance you are pregnant?" "When was your last menstrual period?"       LMP- 3 weeks ago, on birth control  Protocols used: RASH - PURPLE SPOTS OR DOTS-A-AH, BRUISES-A-AH  Patient states she has a bruise around the size of a quarter on the lower portion of her right arm, and she feels a small knot under the bruise.  She does not recall hitting her arm on anything, and it is not painful.  She states she also has been getting a rash- "tiny red dot" on her body intermittently for the past 4 months.  She denies shortness of breath, fever, headache, weakness, unusual bleeding, dizziness.  She notes that she has been taking  some supplements for her skin - "Hair, Skin, and Nails", "Good to Glow", and Glutathione.  Patient does not have a PCP currently, and is scheduled for a new patient appointment on Friday 05/18/2019 to establish care.  Recommended to patient that she go to urgent care for evaluation within the next 4 hours due to having the rash with small red dots.  She states she will likely not follow this advice because she does not want to pay an urgent care copay.  Advised her that I still recommended she go for evaluation at urgent care this evening, but if she does not that she definitely needs to go for evaluation at the ER if she develops fever, shortness of breath, worsening of the bruise or rash.  Patient  agreeable.

## 2019-05-18 ENCOUNTER — Ambulatory Visit (INDEPENDENT_AMBULATORY_CARE_PROVIDER_SITE_OTHER): Payer: 59 | Admitting: Family Medicine

## 2019-05-18 ENCOUNTER — Other Ambulatory Visit: Payer: Self-pay

## 2019-05-18 ENCOUNTER — Encounter: Payer: Self-pay | Admitting: Family Medicine

## 2019-05-18 VITALS — BP 122/84 | HR 100 | Temp 96.9°F | Resp 18 | Ht 63.0 in | Wt 182.0 lb

## 2019-05-18 DIAGNOSIS — R21 Rash and other nonspecific skin eruption: Secondary | ICD-10-CM | POA: Diagnosis not present

## 2019-05-18 DIAGNOSIS — M2550 Pain in unspecified joint: Secondary | ICD-10-CM

## 2019-05-18 DIAGNOSIS — G8929 Other chronic pain: Secondary | ICD-10-CM

## 2019-05-18 DIAGNOSIS — E7800 Pure hypercholesterolemia, unspecified: Secondary | ICD-10-CM

## 2019-05-18 DIAGNOSIS — M25561 Pain in right knee: Secondary | ICD-10-CM

## 2019-05-18 DIAGNOSIS — L729 Follicular cyst of the skin and subcutaneous tissue, unspecified: Secondary | ICD-10-CM | POA: Diagnosis not present

## 2019-05-18 DIAGNOSIS — M25562 Pain in left knee: Secondary | ICD-10-CM

## 2019-05-18 LAB — LIPID PANEL
Cholesterol: 263 mg/dL — ABNORMAL HIGH (ref 0–200)
HDL: 49.4 mg/dL (ref >39.00)
LDL Cholesterol: 178 mg/dL — ABNORMAL HIGH (ref 0–99)
NonHDL: 213.25
Total CHOL/HDL Ratio: 5
Triglycerides: 174 mg/dL — ABNORMAL HIGH (ref 0.0–149.0)
VLDL: 34.8 mg/dL (ref 0.0–40.0)

## 2019-05-18 LAB — COMPREHENSIVE METABOLIC PANEL
ALT: 14 U/L (ref 0–35)
AST: 13 U/L (ref 0–37)
Albumin: 4.1 g/dL (ref 3.5–5.2)
Alkaline Phosphatase: 76 U/L (ref 39–117)
BUN: 10 mg/dL (ref 6–23)
CO2: 27 mEq/L (ref 19–32)
Calcium: 9.5 mg/dL (ref 8.4–10.5)
Chloride: 105 mEq/L (ref 96–112)
Creatinine, Ser: 0.66 mg/dL (ref 0.40–1.20)
GFR: 121.35 mL/min (ref >60.00)
Glucose, Bld: 93 mg/dL (ref 70–99)
Potassium: 3.4 mEq/L — ABNORMAL LOW (ref 3.5–5.1)
Sodium: 138 mEq/L (ref 135–145)
Total Bilirubin: 0.4 mg/dL (ref 0.2–1.2)
Total Protein: 7.5 g/dL (ref 6.0–8.3)

## 2019-05-18 LAB — B12 AND FOLATE PANEL
Folate: 15.3 ng/mL (ref >5.9)
Vitamin B-12: 295 pg/mL (ref 211–911)

## 2019-05-18 LAB — CBC WITH DIFFERENTIAL/PLATELET
Basophils Absolute: 0 10*3/uL (ref 0.0–0.1)
Basophils Relative: 0.6 % (ref 0.0–3.0)
Eosinophils Absolute: 0.1 10*3/uL (ref 0.0–0.7)
Eosinophils Relative: 1.7 % (ref 0.0–5.0)
HCT: 40.4 % (ref 36.0–46.0)
Hemoglobin: 12.8 g/dL (ref 12.0–15.0)
Lymphocytes Relative: 25.7 % (ref 12.0–46.0)
Lymphs Abs: 1.8 10*3/uL (ref 0.7–4.0)
MCHC: 31.8 g/dL (ref 30.0–36.0)
MCV: 76.1 fl — ABNORMAL LOW (ref 78.0–100.0)
Monocytes Absolute: 0.5 10*3/uL (ref 0.1–1.0)
Monocytes Relative: 6.7 % (ref 3.0–12.0)
Neutro Abs: 4.7 10*3/uL (ref 1.4–7.7)
Neutrophils Relative %: 65.3 % (ref 43.0–77.0)
Platelets: 262 10*3/uL (ref 150.0–400.0)
RBC: 5.3 Mil/uL — ABNORMAL HIGH (ref 3.87–5.11)
RDW: 16.7 % — ABNORMAL HIGH (ref 11.5–15.5)
WBC: 7.1 10*3/uL (ref 4.0–10.5)

## 2019-05-18 LAB — HEMOGLOBIN A1C: Hgb A1c MFr Bld: 5.6 % (ref 4.6–6.5)

## 2019-05-18 LAB — VITAMIN D 25 HYDROXY (VIT D DEFICIENCY, FRACTURES): VITD: 26.13 ng/mL — ABNORMAL LOW (ref 30.00–100.00)

## 2019-05-18 MED ORDER — MELOXICAM 7.5 MG PO TABS: 7.5000 mg | ORAL_TABLET | Freq: Every day | ORAL | 1 refills | Status: DC | PRN

## 2019-05-18 NOTE — Progress Notes (Signed)
Subjective:    Patient ID: Madeline Moore, female    DOB: 1980-12-21, 38 y.o.   MRN: [id number]  HPI   Patient presents to clinic to establish PCP.  She has multiple complaints today including pain in both knees, both shoulders, hands off and on for many weeks.  Also has a lump in Moore forearm that has been present for a couple of weeks and a rash that seems to come and go and pop up on arms, upper chest and both legs.  Has dealt with pain in knees and shoulders by doing stretching and taking ibuprofen.  Wonders if her mattress is to partially blame for her pain.  Takes Tylenol or ibuprofen on occasion without much relief.   Noticed a little lump area on Moore forearm about a week ago.  Area is somewhat tender to touch, it is not open or draining.   Denies any new lotions, soaps or detergents that could explain the rash.  States rash at times will be a little itchy and other times will just feel bumpy and dry.  Put topical body cream on the rash areas and they seem to calm down somewhat.  Past medical, social, family, surgical history reviewed and updated accordingly in chart.  Patient Active Problem List   Diagnosis Date Noted  . High cholesterol 05/21/2019  . Chronic pain of both knees 05/21/2019  . Multiple joint pain 05/21/2019  . Gastritis 03/10/2018   Past Medical History:  Diagnosis Date  . Anemia   . Fibroid    Social History   Tobacco Use  . Smoking status: Never Smoker  . Smokeless tobacco: Never Used  Substance Use Topics  . Alcohol use: Not Currently    Comment: occas   Past Surgical History:  Procedure Laterality Date  . CESAREAN SECTION     Family History  Problem Relation Age of Onset  . Diabetes Mother   . Diabetes Sister   . Hypertension Father   . Rheum arthritis Father   . Multiple sclerosis Father     Review of Systems   Constitutional: Negative for chills, fatigue and fever.  HENT: Negative for congestion, ear pain, sinus pain and sore  throat.   Eyes: Negative.   Respiratory: Negative for cough, shortness of breath and wheezing.   Cardiovascular: Negative for chest pain, palpitations and leg swelling.  Gastrointestinal: Negative for abdominal pain, diarrhea, nausea and vomiting.  Genitourinary: Negative for dysuria, frequency and urgency.  Musculoskeletal: +pain in back, shoulders, knees, hands Skin: ?bump Moore forearm. red/raised rash areas on arms, upper chest, legs that comes and goes.  Neurological: Negative for syncope, light-headedness and headaches.  Psychiatric/Behavioral: The patient is not nervous/anxious.    Objective:   Physical Exam Vitals signs and nursing note reviewed.  HENT:     Head: Normocephalic and atraumatic.  Eyes:     General: No scleral icterus.    Extraocular Movements: Extraocular movements intact.     Conjunctiva/sclera: Conjunctivae normal.     Pupils: Pupils are equal, round, and reactive to light.  Cardiovascular:     Rate and Rhythm: Normal rate and regular rhythm.  Pulmonary:     Effort: Pulmonary effort is normal.     Breath sounds: Normal breath sounds.  Abdominal:     General: Bowel sounds are normal. There is no distension.     Palpations: Abdomen is soft. There is no mass.     Tenderness: There is no abdominal tenderness. There is no Moore CVA  tenderness, left CVA tenderness, guarding or rebound.     Hernia: No hernia is present.  Musculoskeletal: Normal range of motion.        General: No swelling or deformity.     Moore lower leg: No edema.     Left lower leg: No edema.  Skin:    General: Skin is warm and dry.     Coloration: Skin is not jaundiced or pale.     Findings: Rash (faint red raised bumpy rash patch on Moore forearm. No other patches of rash seen at this time.) present.     Comments: Small cyst-like area felt in Moore inner forearm, about size of a pea. Feels hard to palpation.  Neurological:     General: No focal deficit present.     Mental Status: She is  alert and oriented to person, place, and time.     Cranial Nerves: No cranial nerve deficit.     Gait: Gait normal.  Psychiatric:        Mood and Affect: Mood normal.        Behavior: Behavior normal.     Today's Vitals   05/18/19 1332  BP: 122/84  Pulse: 100  Resp: 18  Temp: (!) 96.9 F (36.1 C)  TempSrc: Temporal  SpO2: 97%  Weight: 182 lb (82.6 kg)  Height: 5\' 3"  (1.6 m)   Body mass index is 32.24 kg/m.     Assessment & Plan:    A total of 45  minutes were spent face-to-face with the patient during this encounter and over half of that time was spent on counseling and coordination of care. The patient was counseled on causes of rash, creams to help treat, hard to determine what the lump in forearm is and how I suspect cyst, girls to dermatology orthopedics; lab work that we will be ordering today.   Rash/cyst of skin - long discussion with patient in regards to what I think the lump area is and what rash is from. suspect lump on Moore forearm is a small cyst.  Advised patient that oftentimes to know for sure exactly what type of cyst that has to be surgically removed.  Rash appears to be some sort of contact dermatitis.  It does not seem very inflamed at this time, so I have advised patient to use a topical moisturizing cream from either CeraVe, Cetaphil or Aveeno to help moisturize and soothe skin.  Will refer to dermatology for further evaluation.  Patient also interested in ultrasound of area for further investigation, forearm ultrasound placed.  Multiple joint pain/knee pain - upon physical exam in clinic, joints all seem to have a normal range of motion and patient does not have any obvious or acute tenderness.  Family history of rheumatoid arthritis, so we will include rheumatoid factor and ANA and lab work.  She is also interested in seeing orthopedics for further evaluation of knee pain especially.  She will trial Mobic as needed to see if this helps reduce joint pain and  also has been advised to do stretches, and exercise every day to help keep joints from being stiff and keep body mobile  High cholesterol - patient has history of high cholesterol, we will look at lab work to see where her numbers stand.  Lab work collected in clinic today.  We will plan to do complete physical exam at some point during this calendar year.  Patient aware that she will be contacted in regards to lab results as well  as ultrasound appointment.  Advised if she does not hear anything about referral to ultrasound the next 2 weeks to call office.

## 2019-05-21 DIAGNOSIS — E785 Hyperlipidemia, unspecified: Secondary | ICD-10-CM | POA: Insufficient documentation

## 2019-05-21 DIAGNOSIS — M25562 Pain in left knee: Secondary | ICD-10-CM | POA: Insufficient documentation

## 2019-05-21 DIAGNOSIS — M2550 Pain in unspecified joint: Secondary | ICD-10-CM | POA: Insufficient documentation

## 2019-05-21 DIAGNOSIS — G8929 Other chronic pain: Secondary | ICD-10-CM | POA: Insufficient documentation

## 2019-05-21 DIAGNOSIS — E7800 Pure hypercholesterolemia, unspecified: Secondary | ICD-10-CM | POA: Insufficient documentation

## 2019-05-21 DIAGNOSIS — M25561 Pain in right knee: Secondary | ICD-10-CM | POA: Insufficient documentation

## 2019-05-21 LAB — ANTI-NUCLEAR AB-TITER (ANA TITER)
ANA TITER: 1:40 {titer} — ABNORMAL HIGH
ANA Titer 1: 1:40 {titer} — ABNORMAL HIGH

## 2019-05-21 LAB — THYROID PANEL WITH TSH
Free Thyroxine Index: 2.7 (ref 1.4–3.8)
T3 Uptake: 21 % — ABNORMAL LOW (ref 22–35)
T4, Total: 12.8 ug/dL — ABNORMAL HIGH (ref 5.1–11.9)
TSH: 0.62 mIU/L

## 2019-05-21 LAB — ANA: Anti Nuclear Antibody (ANA): POSITIVE — AB

## 2019-05-21 LAB — RHEUMATOID FACTOR: Rheumatoid fact SerPl-aCnc: <14 IU/mL (ref <14)

## 2019-05-22 ENCOUNTER — Other Ambulatory Visit: Payer: Self-pay | Admitting: Family Medicine

## 2019-05-22 DIAGNOSIS — R7989 Other specified abnormal findings of blood chemistry: Secondary | ICD-10-CM

## 2019-05-22 DIAGNOSIS — R768 Other specified abnormal immunological findings in serum: Secondary | ICD-10-CM

## 2019-05-23 ENCOUNTER — Other Ambulatory Visit: Payer: Self-pay | Admitting: Family Medicine

## 2019-05-23 DIAGNOSIS — E782 Mixed hyperlipidemia: Secondary | ICD-10-CM

## 2019-05-23 MED ORDER — ROSUVASTATIN CALCIUM 10 MG PO TABS: 10.0000 mg | ORAL_TABLET | Freq: Every day | ORAL | 3 refills | Status: DC

## 2019-05-24 ENCOUNTER — Other Ambulatory Visit: Payer: Self-pay | Admitting: Family Medicine

## 2019-05-29 ENCOUNTER — Telehealth: Payer: Self-pay

## 2019-05-29 DIAGNOSIS — R768 Other specified abnormal immunological findings in serum: Secondary | ICD-10-CM

## 2019-05-29 NOTE — Telephone Encounter (Signed)
Copied from [place] [id number]. Topic: Referral - Status >> May 29, 2019  4:53 PM Reyne Dumas L wrote: Reason for CRM:   Pt states that Ander Purpura Guse told her she would be referred to a rheumatologist.  Pt hasn't heard anything more about that.  Pt states that her referral was supposed to be done because an ANA test came up positive.

## 2019-05-30 ENCOUNTER — Ambulatory Visit: Payer: 59

## 2019-05-30 NOTE — Telephone Encounter (Signed)
I do not see a rheumatology referral for patient. Please let me know if you would like me to enter referral? Melissa

## 2019-05-30 NOTE — Telephone Encounter (Signed)
Pt states that Ander Purpura Guse told her she would be referred to a rheumatologist.  Pt hasn't heard anything more about that.  Pt states that her referral was supposed to be done because an ANA test came up positive.

## 2019-05-31 NOTE — Addendum Note (Signed)
Addended by: Philis Nettle on: 05/31/2019 07:58 AM   Modules accepted: Orders

## 2019-06-01 ENCOUNTER — Ambulatory Visit: Payer: 59

## 2019-06-07 ENCOUNTER — Other Ambulatory Visit: Payer: 59

## 2019-06-13 DIAGNOSIS — M25561 Pain in right knee: Secondary | ICD-10-CM | POA: Diagnosis not present

## 2019-06-13 DIAGNOSIS — R768 Other specified abnormal immunological findings in serum: Secondary | ICD-10-CM | POA: Diagnosis not present

## 2019-06-13 DIAGNOSIS — M25511 Pain in right shoulder: Secondary | ICD-10-CM | POA: Diagnosis not present

## 2019-06-13 DIAGNOSIS — M2550 Pain in unspecified joint: Secondary | ICD-10-CM | POA: Diagnosis not present

## 2019-06-13 DIAGNOSIS — G8929 Other chronic pain: Secondary | ICD-10-CM | POA: Diagnosis not present

## 2019-06-13 DIAGNOSIS — M25562 Pain in left knee: Secondary | ICD-10-CM | POA: Diagnosis not present

## 2019-06-14 DIAGNOSIS — R768 Other specified abnormal immunological findings in serum: Secondary | ICD-10-CM | POA: Diagnosis not present

## 2019-06-14 DIAGNOSIS — G8929 Other chronic pain: Secondary | ICD-10-CM | POA: Diagnosis not present

## 2019-06-14 DIAGNOSIS — M25511 Pain in right shoulder: Secondary | ICD-10-CM | POA: Diagnosis not present

## 2019-06-14 DIAGNOSIS — M25561 Pain in right knee: Secondary | ICD-10-CM | POA: Diagnosis not present

## 2019-06-14 DIAGNOSIS — M25562 Pain in left knee: Secondary | ICD-10-CM | POA: Diagnosis not present

## 2019-06-14 DIAGNOSIS — M2550 Pain in unspecified joint: Secondary | ICD-10-CM | POA: Diagnosis not present

## 2019-07-16 ENCOUNTER — Other Ambulatory Visit: Payer: Self-pay

## 2019-07-17 ENCOUNTER — Other Ambulatory Visit: Payer: 59

## 2019-07-17 ENCOUNTER — Ambulatory Visit (INDEPENDENT_AMBULATORY_CARE_PROVIDER_SITE_OTHER): Payer: 59 | Admitting: Family Medicine

## 2019-07-17 ENCOUNTER — Encounter: Payer: Self-pay | Admitting: Family Medicine

## 2019-07-17 VITALS — BP 116/78 | HR 86 | Temp 97.3°F | Resp 15 | Ht 63.0 in | Wt 183.0 lb

## 2019-07-17 DIAGNOSIS — Z3009 Encounter for other general counseling and advice on contraception: Secondary | ICD-10-CM | POA: Diagnosis not present

## 2019-07-17 DIAGNOSIS — M2550 Pain in unspecified joint: Secondary | ICD-10-CM

## 2019-07-17 DIAGNOSIS — E782 Mixed hyperlipidemia: Secondary | ICD-10-CM

## 2019-07-17 DIAGNOSIS — R7989 Other specified abnormal findings of blood chemistry: Secondary | ICD-10-CM

## 2019-07-17 DIAGNOSIS — G8929 Other chronic pain: Secondary | ICD-10-CM | POA: Diagnosis not present

## 2019-07-17 DIAGNOSIS — M25511 Pain in right shoulder: Secondary | ICD-10-CM | POA: Diagnosis not present

## 2019-07-17 MED ORDER — NORETHINDRONE ACET-ETHINYL EST 1-20 MG-MCG PO TABS: 1.0000 | ORAL_TABLET | Freq: Every day | ORAL | 11 refills | Status: DC

## 2019-07-17 MED ORDER — ROSUVASTATIN CALCIUM 10 MG PO TABS: 10.0000 mg | ORAL_TABLET | Freq: Every day | ORAL | 3 refills | Status: AC

## 2019-07-17 MED ORDER — CYCLOBENZAPRINE HCL 5 MG PO TABS: 5.0000 mg | ORAL_TABLET | Freq: Three times a day (TID) | ORAL | 1 refills | Status: DC | PRN

## 2019-07-17 NOTE — Progress Notes (Signed)
Subjective:    Patient ID: Madeline Moore, female    DOB: October 29, 1980, 38 y.o.   MRN: [id number]  HPI   Patient presents to clinic for multiple reasons.  First is to have thyroid rechecked due to low T3  with last check.  Second is for continued pain in the joints especially currently in Moore shoulder.  Did see rheumatology due to positive ANA, they are doing further blood work.  They also did x-rays of shoulders and knees and did not find anything remarkable in these images.  She is supposed to follow-up in about 1 month after seeing end of September.   Last issue is to discuss birth control.  Patient would like to remain on Microgestin.  Has tried different lines throughout her life feels effective for her.    Needs refill on Crestor.  Feels stable on this medication.  Denies stomach pains or any adverse reactions to this med.   Patient Active Problem List   Diagnosis Date Noted  . High cholesterol 05/21/2019  . Chronic pain of both knees 05/21/2019  . Multiple joint pain 05/21/2019  . Gastritis 03/10/2018   Social History   Tobacco Use  . Smoking status: Never Smoker  . Smokeless tobacco: Never Used  Substance Use Topics  . Alcohol use: Yes    Comment: occas   Review of Systems  Constitutional: Negative for chills, fatigue and fever.  HENT: Negative for congestion, ear pain, sinus pain and sore throat.   Eyes: Negative.   Respiratory: Negative for cough, shortness of breath and wheezing.   Cardiovascular: Negative for chest pain, palpitations and leg swelling.  Gastrointestinal: Negative for abdominal pain, diarrhea, nausea and vomiting.  Genitourinary: Negative for dysuria, frequency and urgency.  Musculoskeletal: +multiple joint pain including knees and Moore shoulder Skin: Negative for color change, pallor and rash.  Neurological: Negative for syncope, light-headedness and headaches.  Psychiatric/Behavioral: The patient is not nervous/anxious.        Objective:   Physical Exam Vitals signs and nursing note reviewed.  Constitutional:      General: She is not in acute distress.    Appearance: She is obese. She is not toxic-appearing.  HENT:     Head: Normocephalic and atraumatic.  Cardiovascular:     Rate and Rhythm: Normal rate and regular rhythm.     Heart sounds: Normal heart sounds.  Pulmonary:     Effort: Pulmonary effort is normal. No respiratory distress.     Breath sounds: Normal breath sounds.  Musculoskeletal:     Moore lower leg: No edema.     Left lower leg: No edema.     Comments: States pain with raising Moore arm up above head in shoulder  Able to reach arms in front of her and behind her, able to hold arms straight up front of her and resist me pushing down.  Grips equal and strong.  Skin:    General: Skin is warm and dry.     Coloration: Skin is not jaundiced or pale.  Neurological:     Mental Status: She is alert and oriented to person, place, and time.     Gait: Gait normal.  Psychiatric:        Mood and Affect: Mood normal.        Behavior: Behavior normal.     Today's Vitals   07/17/19 1027  BP: 116/78  Pulse: 86  Resp: 15  Temp: (!) 97.3 F (36.3 C)  TempSrc: Temporal  SpO2:  97%  Weight: 183 lb (83 kg)  Height: 5\' 3"  (1.6 m)   Body mass index is 32.42 kg/m.     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  She will get refill on cholesterol medication, feels  good on Crestor and it does overall help control her cholesterol levels.  Encouraged to continue follow-up with rheumatology in regards to multiple joint pains, she is also open to referral to sports medicine for further investigation into her joint pain mainly her Moore shoulder.  She will use Flexeril as needed for muscle spasms.  She will follow up here every 6 months for general checkups on chronic issues and whenever needed.

## 2019-07-18 LAB — THYROID PANEL WITH TSH
Free Thyroxine Index: 2.6 (ref 1.4–3.8)
T3 Uptake: 21 % — ABNORMAL LOW (ref 22–35)
T4, Total: 12.3 ug/dL — ABNORMAL HIGH (ref 5.1–11.9)
TSH: 0.63 mIU/L

## 2019-07-20 NOTE — Addendum Note (Signed)
Addended by: Philis Nettle on: 07/20/2019 12:30 PM   Modules accepted: Orders

## 2019-08-23 ENCOUNTER — Ambulatory Visit: Payer: 59 | Admitting: Internal Medicine

## 2019-09-25 ENCOUNTER — Other Ambulatory Visit: Payer: Self-pay

## 2019-09-27 ENCOUNTER — Encounter: Payer: Self-pay | Admitting: Internal Medicine

## 2019-09-27 ENCOUNTER — Other Ambulatory Visit: Payer: Self-pay

## 2019-09-27 ENCOUNTER — Ambulatory Visit (INDEPENDENT_AMBULATORY_CARE_PROVIDER_SITE_OTHER): Payer: 59 | Admitting: Internal Medicine

## 2019-09-27 VITALS — BP 118/84 | HR 114 | Temp 98.4°F | Ht 63.0 in | Wt 186.4 lb

## 2019-09-27 DIAGNOSIS — R946 Abnormal results of thyroid function studies: Secondary | ICD-10-CM | POA: Diagnosis not present

## 2019-09-27 DIAGNOSIS — E0789 Other specified disorders of thyroid: Secondary | ICD-10-CM

## 2019-09-27 LAB — T3, FREE: T3, Free: 3.3 pg/mL (ref 2.3–4.2)

## 2019-09-27 LAB — TSH: TSH: 1.85 u[IU]/mL (ref 0.35–4.50)

## 2019-09-27 LAB — T4, FREE: Free T4: 0.83 ng/dL (ref 0.60–1.60)

## 2019-09-27 NOTE — Patient Instructions (Signed)
-   Please stop by the lab today- will contact you with the results   -  Please remember to hold Biotin ( skin, hair and nail vitamin) for 48 hours prior to any future thyroid testing

## 2019-09-27 NOTE — Progress Notes (Signed)
Name: Madeline Moore  MRN/ DOB: [id number], 04-22-1981    Age/ Sex: 39 y.o., female    PCP: Jodelle Green, FNP   Reason for Endocrinology Evaluation:      Date of Initial Endocrinology Evaluation: 09/28/2019     HPI: Madeline Moore is a 39 y.o. female with a past medical history of dyslipidemia. The patient presented for initial endocrinology clinic visit on 09/28/2019 for consultative assistance with her Low T3.   Pt was found to have an elevated total T4 and low T3 uptake on routine labs in 04/2019. These results were confirmed in 06/2019.    Today she is c/o right shoulder pain  and headache s/p fall. She has been gaining weight, no weight loss. She denies palpitations, anxiety, or jittery sensation.  She denies any tremors   She denies any local neck symptoms No FH of thyroid disease  She usually takes Biotin- but has not been taking it in 2-3 weeks.     HISTORY:  Past Medical History:  Past Medical History:  Diagnosis Date  . Anemia   . Fibroid    Past Surgical History:  Past Surgical History:  Procedure Laterality Date  . CESAREAN SECTION        Social History:  reports that she has never smoked. She has never used smokeless tobacco. She reports current alcohol use. She reports that she does not use drugs.  Family History: family history includes Diabetes in her mother and sister; Hypertension in her father; Multiple sclerosis in her father; Rheum arthritis in her father.   HOME MEDICATIONS: Allergies as of 09/27/2019      Reactions   Doxycycline Nausea And Vomiting      Medication List       Accurate as of September 27, 2019 11:59 PM. If you have any questions, ask your nurse or doctor.        cyclobenzaprine 5 MG tablet Commonly known as: FLEXERIL Take 1 tablet (5 mg total) by mouth 3 (three) times daily as needed for muscle spasms.   norethindrone-ethinyl estradiol 1-20 MG-MCG tablet Commonly known as: MICROGESTIN Take 1 tablet by mouth  daily.   rosuvastatin 10 MG tablet Commonly known as: Crestor Take 1 tablet (10 mg total) by mouth daily.         REVIEW OF SYSTEMS: A comprehensive ROS was conducted with the patient and is negative except as per HPI and below:  ROS     OBJECTIVE:  VS: BP 118/84 (BP Location: Left Arm, Patient Position: Sitting, Cuff Size: Large)   Pulse (!) 114   Temp 98.4 F (36.9 C)   Ht 5\' 3"  (1.6 m)   Wt 186 lb 6.4 oz (84.6 kg)   SpO2 98%   BMI 33.02 kg/m    Wt Readings from Last 3 Encounters:  09/27/19 186 lb 6.4 oz (84.6 kg)  07/17/19 183 lb (83 kg)  05/18/19 182 lb (82.6 kg)     EXAM: General: Pt appears well and is in NAD  Eyes: External eye exam normal without stare, lid lag or exophthalmos.  EOM intact.    Neck: General: Supple without adenopathy. Thyroid: Thyroid size normal.  No goiter or nodules appreciated. No thyroid bruit.  Lungs: Clear with good BS bilat with no rales, rhonchi, or wheezes  Heart: Auscultation: RRR.  Abdomen: Normoactive bowel sounds, soft, nontender, without masses or organomegaly palpable  Extremities:  BL LE: No pretibial edema normal ROM and strength.  Skin: Hair:  Texture and amount normal with gender appropriate distribution Skin Inspection: No rashes Skin Palpation: Skin temperature, texture, and thickness normal to palpation  Neuro: Cranial nerves: II - XII grossly intact  Motor: Normal strength throughout  Mental Status: Judgment, insight: Intact Orientation: Oriented to time, place, and person Mood and affect: No depression, anxiety, or agitation     DATA REVIEWED: Results for JOLLENE, RAWL (MRN [id number]) as of 09/28/2019 09:12  Ref. Range 09/27/2019 09:53  TSH Latest Ref Range: 0.35 - 4.50 uIU/mL 1.85  Triiodothyronine,Free,Serum Latest Ref Range: 2.3 - 4.2 pg/mL 3.3  T4,Free(Direct) Latest Ref Range: 0.60 - 1.60 ng/dL 0.83      ASSESSMENT/PLAN/RECOMMENDATIONS:   1. Thyroxine Binding Globulin Excess:   - Both T4 and T3  circulate in the blood bound to proteins. Seventy five percent of these hormones are bound to thyroxine binding globulin (TBG). One of the most common causes of elevated TBG is estrogen, which the patient on on combined contraception pills .  - Checking total T4 and T3 is going to measure both the bound and the free form and in this patient her total T4 and T3 are elevated for that reason.  - Moving forward I would recommend checking Free T4 and Free T3 forms to eliminate any interference.  - Repeat free T3 and T4 are normal. There's no concern for thyroid disease in this patient.   F/U PRN   Signed electronically by: Mack Guise, MD  [facility]  [address] [place], [place] [address] Phone: [phone] FAX: [phone]   CC: Jodelle Green, FNP No address on file Phone: None Fax: None   Return to Endocrinology clinic as below: No future appointments.

## 2019-09-28 ENCOUNTER — Encounter: Payer: Self-pay | Admitting: Internal Medicine

## 2019-09-28 DIAGNOSIS — E0789 Other specified disorders of thyroid: Secondary | ICD-10-CM | POA: Insufficient documentation

## 2020-02-04 ENCOUNTER — Telehealth: Payer: Self-pay | Admitting: Obstetrics and Gynecology

## 2020-02-04 NOTE — Telephone Encounter (Signed)
Pt called in and stated that she has been bleeding for 2 months and she has cramps too. The pt is requesting a call. She stated she thinks its her BC. The [facility] doesn't have iron in it. Please advise

## 2020-02-04 NOTE — Telephone Encounter (Signed)
I called the pt vm is not set up.

## 2020-02-04 NOTE — Telephone Encounter (Signed)
Patient needs to be seen. Please schedule appointment.

## 2020-02-07 ENCOUNTER — Other Ambulatory Visit (HOSPITAL_COMMUNITY)
Admission: RE | Admit: 2020-02-07 | Discharge: 2020-02-07 | Disposition: A | Discharge status: Home / Self Care | Payer: 59 | Source: Ambulatory Visit | Attending: Obstetrics and Gynecology | Admitting: Obstetrics and Gynecology

## 2020-02-07 ENCOUNTER — Encounter: Payer: Self-pay | Admitting: Obstetrics and Gynecology

## 2020-02-07 ENCOUNTER — Other Ambulatory Visit: Payer: Self-pay

## 2020-02-07 ENCOUNTER — Ambulatory Visit (INDEPENDENT_AMBULATORY_CARE_PROVIDER_SITE_OTHER): Payer: 59 | Admitting: Obstetrics and Gynecology

## 2020-02-07 VITALS — BP 110/78 | HR 80 | Ht 63.0 in | Wt 183.0 lb

## 2020-02-07 DIAGNOSIS — Z124 Encounter for screening for malignant neoplasm of cervix: Secondary | ICD-10-CM | POA: Diagnosis not present

## 2020-02-07 DIAGNOSIS — N921 Excessive and frequent menstruation with irregular cycle: Secondary | ICD-10-CM

## 2020-02-07 DIAGNOSIS — D219 Benign neoplasm of connective and other soft tissue, unspecified: Secondary | ICD-10-CM | POA: Diagnosis not present

## 2020-02-07 DIAGNOSIS — Z01419 Encounter for gynecological examination (general) (routine) without abnormal findings: Secondary | ICD-10-CM

## 2020-02-07 NOTE — Progress Notes (Signed)
HPI:      Madeline Moore is a 39 y.o. [phone] who LMP was No LMP recorded (lmp unknown).  Subjective:   She presents today for her annual examination.  She also has multiple other issues that she is requiring that we discuss.  She tends to dictate her medical care based on her own "research".  Since her last visit here in 2018 she has seen another OB/GYN who changed her OCPs.  And then she states "I had to let them go" because they had a difference of opinion regarding her management.  She states then that a nurse practitioner somewhere gave her a Microgestin pill with iron and she insists she had regular cycles on this pill.  Until a another nurse practitioner changed her pills to her current formulation.  She states that she has been bleeding for the past "2 months" and that her bleeding just stopped within the last 2 days.  So she decided to "give me another try".  She continues on OCPs.  She has been "researching pills that contain iron because that is the pill she wants". In addition she is requesting mammography because her "sisters have had it and they have dense breasts".  She has no family history of breast cancer and although I told her mammography typically starts at age 39 she insisted on having this done now and said "I will pay for it if I have to". She is also requesting ultrasound because she says she has a history of uterine fibroids and wants to know if they have changed at all.     Hx: The following portions of the patient's history were reviewed and updated as appropriate:             She  has a past medical history of Anemia and Fibroid. She does not have any pertinent problems on file. She  has a past surgical history that includes Cesarean section. Her family history includes Diabetes in her mother and sister; Hypertension in her father; Multiple sclerosis in her father; Rheum arthritis in her father. She  reports that she has never smoked. She has never used smokeless  tobacco. She reports current alcohol use. She reports that she does not use drugs. She has a current medication list which includes the following prescription(s): norethindrone-ethinyl estradiol, cyclobenzaprine, and rosuvastatin. She is allergic to doxycycline.       Review of Systems:  Review of Systems  Constitutional: Denied constitutional symptoms, night sweats, recent illness, fatigue, fever, insomnia and weight loss.  Eyes: Denied eye symptoms, eye pain, photophobia, vision change and visual disturbance.  Ears/Nose/Throat/Neck: Denied ear, nose, throat or neck symptoms, hearing loss, nasal discharge, sinus congestion and sore throat.  Cardiovascular: Denied cardiovascular symptoms, arrhythmia, chest pain/pressure, edema, exercise intolerance, orthopnea and palpitations.  Respiratory: Denied pulmonary symptoms, asthma, pleuritic pain, productive sputum, cough, dyspnea and wheezing.  Gastrointestinal: Denied, gastro-esophageal reflux, melena, nausea and vomiting.  Genitourinary: See HPI for additional information.  Musculoskeletal: Denied musculoskeletal symptoms, stiffness, swelling, muscle weakness and myalgia.  Dermatologic: Denied dermatology symptoms, rash and scar.  Neurologic: Denied neurology symptoms, dizziness, headache, neck pain and syncope.  Psychiatric: Denied psychiatric symptoms, anxiety and depression.  Endocrine: Denied endocrine symptoms including hot flashes and night sweats.   Meds:   Current Outpatient Medications on File Prior to Visit  Medication Sig Dispense Refill  . norethindrone-ethinyl estradiol (MICROGESTIN) 1-20 MG-MCG tablet Take 1 tablet by mouth daily. 1 Package 11  . cyclobenzaprine (FLEXERIL) 5 MG tablet  Take 1 tablet (5 mg total) by mouth 3 (three) times daily as needed for muscle spasms. (Patient not taking: Reported on 02/07/2020) 30 tablet 1  . rosuvastatin (CRESTOR) 10 MG tablet Take 1 tablet (10 mg total) by mouth daily. (Patient not taking:  Reported on 02/07/2020) 90 tablet 3   No current facility-administered medications on file prior to visit.    Objective:     Vitals:   02/07/20 0919  BP: 110/78  Pulse: 80              Physical examination General NAD, Conversant  HEENT Atraumatic; Op clear with mmm.  Normo-cephalic. Pupils reactive. Anicteric sclerae  Thyroid/Neck Smooth without nodularity or enlargement. Normal ROM.  Neck Supple.  Skin No rashes, lesions or ulceration. Normal palpated skin turgor. No nodularity.  Breasts: No masses or discharge.  Symmetric.  No axillary adenopathy.  Lungs: Clear to auscultation.No rales or wheezes. Normal Respiratory effort, no retractions.  Heart: NSR.  No murmurs or rubs appreciated. No periferal edema  Abdomen: Soft.  Non-tender.  No masses.  No HSM. No hernia  Extremities: Moves all appropriately.  Normal ROM for age. No lymphadenopathy.  Neuro: Oriented to PPT.  Normal mood. Normal affect.     Pelvic:   Vulva: Normal appearance.  No lesions.  Vagina: No lesions or abnormalities noted.  Support: Normal pelvic support.  Urethra No masses tenderness or scarring.  Meatus Normal size without lesions or prolapse.  Cervix: Normal appearance.  No lesions.  Very far anterior  Anus: Normal exam.  No lesions.  Perineum: Normal exam.  No lesions.        Bimanual   Uterus:  Approximately 10 weeks size non-tender.  Mobile.  AV.  Adnexae: No masses.  Non-tender to palpation.  Cul-de-sac: Negative for abnormality.      Assessment:    [id number] Patient Active Problem List   Diagnosis Date Noted  . 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 High cholesterol 05/21/2019  . Chronic pain of both knees 05/21/2019  . Multiple joint pain 05/21/2019  . Gastritis 03/10/2018     1. Well woman exam with routine gynecological exam   2. Fibroids   3. Breakthrough bleeding on OCPs   4. Screening for cervical cancer        Plan:            1.  Basic Screening  Recommendations The basic screening recommendations for asymptomatic women were discussed with the patient during her visit.  The age-appropriate recommendations were discussed with her and the rational for the tests reviewed.  When I am informed by the patient that another primary care physician has previously obtained the age-appropriate tests and they are up-to-date, only outstanding tests are ordered and referrals given as necessary.  Abnormal results of tests will be discussed with her when all of her results are completed.  Routine preventative health maintenance measures emphasized: Exercise/Diet/Weight control, Tobacco Warnings, Alcohol/Substance use risks and Stress Management Pap performed -mammogram ordered at patient's insistence. 2.  Ultrasound to investigate uterine fibroids and patient's DUB. 3.  Patient states that since she has stopped bleeding she will continue on her current pills. Orders Orders Placed This Encounter  Procedures  . US PELVIS TRANSVAGINAL NON-OB (TV ONLY)  . US PELVIS (TRANSABDOMINAL ONLY)  . MM DIGITAL SCREENING BILATERAL    No orders of the defined types were placed in this encounter.       F/U  Return for We will contact her  with any abnormal test results. I spent 22 minutes involved in the care of this patient preparing to see the patient by obtaining and reviewing her medical history (including labs, imaging tests and prior procedures), documenting clinical information in the electronic health record (EHR), counseling and coordinating care plans, writing and sending prescriptions, ordering tests or procedures and directly communicating with the patient by discussing pertinent items from her history and physical exam as well as detailing my assessment and plan as noted above so that she has an informed understanding.  All of her questions were answered.  This time was in excess of a normal annual examination where the patient insisted on discussing the issues  noted above.  Finis Bud, M.D. 02/07/2020 11:47 AM

## 2020-02-08 LAB — CYTOLOGY - PAP
Adequacy: ABSENT
Comment: NEGATIVE
Diagnosis: NEGATIVE
High risk HPV: NEGATIVE

## 2020-02-13 ENCOUNTER — Ambulatory Visit (INDEPENDENT_AMBULATORY_CARE_PROVIDER_SITE_OTHER): Payer: 59

## 2020-02-13 ENCOUNTER — Other Ambulatory Visit: Payer: Self-pay

## 2020-02-13 DIAGNOSIS — N921 Excessive and frequent menstruation with irregular cycle: Secondary | ICD-10-CM | POA: Diagnosis not present

## 2020-02-13 DIAGNOSIS — D219 Benign neoplasm of connective and other soft tissue, unspecified: Secondary | ICD-10-CM | POA: Diagnosis not present

## 2020-02-22 ENCOUNTER — Ambulatory Visit: Payer: 59 | Admitting: Family

## 2020-02-22 ENCOUNTER — Telehealth: Payer: Self-pay | Admitting: Obstetrics and Gynecology

## 2020-02-22 NOTE — Telephone Encounter (Signed)
Called patient back. Patient ask me to hold for her to disconnected another call. When she did this I was disconnected. I have tried to call patient twice with [place] results. Unable to leave voicemail due to voicemail being full.

## 2020-02-22 NOTE — Telephone Encounter (Signed)
Spoke with patient about results. Please see result notes.

## 2020-02-22 NOTE — Telephone Encounter (Signed)
Patient called in stating she hasn't received her u/s results. Could you please advise?

## 2020-03-03 ENCOUNTER — Ambulatory Visit: Payer: 59 | Admitting: Family

## 2020-03-25 ENCOUNTER — Telehealth: Payer: Self-pay | Admitting: General Practice

## 2020-03-25 ENCOUNTER — Other Ambulatory Visit: Payer: Self-pay

## 2020-03-25 DIAGNOSIS — Z3009 Encounter for other general counseling and advice on contraception: Secondary | ICD-10-CM

## 2020-03-25 MED ORDER — NORETHINDRONE ACET-ETHINYL EST 1-20 MG-MCG PO TABS: 1.0000 | ORAL_TABLET | Freq: Every day | ORAL | 0 refills | Status: DC

## 2020-03-25 NOTE — Telephone Encounter (Signed)
Will you please call patient back since I am not there? Thanks.

## 2020-03-25 NOTE — Telephone Encounter (Signed)
I scheduled pt for 04/10/20. Informed pt that it is important for her to come to appt due to her not being seen since October with [facility]. Pt verbalized understanding.

## 2020-03-25 NOTE — Telephone Encounter (Signed)
Pt states that she took her last birth control pill last night. I let pt know that she needs an appt with provider since Guse is no longer here. She has canceled two appts last month for TOC/pap. She states that she does not mind making another appt but needs birth control ASAP. Please advise on what to do.

## 2020-03-25 NOTE — Telephone Encounter (Signed)
Patient will need to transfer to a new PCP ASAP. She will not be able to receive further refills if not soon. I will refill her BC but only for 30 days.

## 2020-03-28 ENCOUNTER — Ambulatory Visit: Payer: 59 | Attending: Internal Medicine

## 2020-03-28 DIAGNOSIS — Z23 Encounter for immunization: Secondary | ICD-10-CM

## 2020-03-28 NOTE — Progress Notes (Signed)
   Covid-19 Vaccination Clinic  Name:  Madeline Moore    MRN: [id number] DOB: 1981-06-01  03/28/2020  [name] was observed post Covid-19 immunization for 15 minutes without incident. She was provided with Vaccine Information Sheet and instruction to access the V-Safe system.   [name] was instructed to call 911 with any severe reactions post vaccine: Marland Kitchen Difficulty breathing  . Swelling of face and throat  . A fast heartbeat  . A bad rash all over body  . Dizziness and weakness   Immunizations Administered    Name Date Dose VIS Date Route   Pfizer COVID-19 Vaccine 03/28/2020  8:34 AM 0.3 mL 11/14/2018 Intramuscular   Manufacturer: [place]   Lot: [id number]   [place]: [id number]

## 2020-04-08 ENCOUNTER — Other Ambulatory Visit: Payer: Self-pay

## 2020-04-10 ENCOUNTER — Other Ambulatory Visit: Payer: Self-pay

## 2020-04-10 ENCOUNTER — Ambulatory Visit (INDEPENDENT_AMBULATORY_CARE_PROVIDER_SITE_OTHER): Payer: 59 | Admitting: Nurse Practitioner

## 2020-04-10 ENCOUNTER — Encounter: Payer: Self-pay | Admitting: Nurse Practitioner

## 2020-04-10 VITALS — BP 104/78 | HR 94 | Temp 97.9°F | Ht 63.0 in | Wt 182.0 lb

## 2020-04-10 DIAGNOSIS — Z3009 Encounter for other general counseling and advice on contraception: Secondary | ICD-10-CM

## 2020-04-10 DIAGNOSIS — E785 Hyperlipidemia, unspecified: Secondary | ICD-10-CM

## 2020-04-10 DIAGNOSIS — Z8742 Personal history of other diseases of the female genital tract: Secondary | ICD-10-CM

## 2020-04-10 DIAGNOSIS — E669 Obesity, unspecified: Secondary | ICD-10-CM | POA: Diagnosis not present

## 2020-04-10 LAB — CBC WITH DIFFERENTIAL/PLATELET
Basophils Absolute: 0.1 10*3/uL (ref 0.0–0.1)
Basophils Relative: 1.3 % (ref 0.0–3.0)
Eosinophils Absolute: 0.1 10*3/uL (ref 0.0–0.7)
Eosinophils Relative: 1 % (ref 0.0–5.0)
HCT: 40.6 % (ref 36.0–46.0)
Hemoglobin: 13.3 g/dL (ref 12.0–15.0)
Lymphocytes Relative: 31.2 % (ref 12.0–46.0)
Lymphs Abs: 2.6 10*3/uL (ref 0.7–4.0)
MCHC: 32.7 g/dL (ref 30.0–36.0)
MCV: 77.3 fl — ABNORMAL LOW (ref 78.0–100.0)
Monocytes Absolute: 0.4 10*3/uL (ref 0.1–1.0)
Monocytes Relative: 4.8 % (ref 3.0–12.0)
Neutro Abs: 5.1 10*3/uL (ref 1.4–7.7)
Neutrophils Relative %: 61.7 % (ref 43.0–77.0)
Platelets: 251 10*3/uL (ref 150.0–400.0)
RBC: 5.26 Mil/uL — ABNORMAL HIGH (ref 3.87–5.11)
RDW: 17.2 % — ABNORMAL HIGH (ref 11.5–15.5)
WBC: 8.3 10*3/uL (ref 4.0–10.5)

## 2020-04-10 LAB — LIPID PANEL
Cholesterol: 229 mg/dL — ABNORMAL HIGH (ref 0–200)
HDL: 52.5 mg/dL (ref >39.00)
LDL Cholesterol: 145 mg/dL — ABNORMAL HIGH (ref 0–99)
NonHDL: 176.51
Total CHOL/HDL Ratio: 4
Triglycerides: 157 mg/dL — ABNORMAL HIGH (ref 0.0–149.0)
VLDL: 31.4 mg/dL (ref 0.0–40.0)

## 2020-04-10 LAB — COMPREHENSIVE METABOLIC PANEL
ALT: 17 U/L (ref 0–35)
AST: 12 U/L (ref 0–37)
Albumin: 4.1 g/dL (ref 3.5–5.2)
Alkaline Phosphatase: 69 U/L (ref 39–117)
BUN: 7 mg/dL (ref 6–23)
CO2: 26 mEq/L (ref 19–32)
Calcium: 9.3 mg/dL (ref 8.4–10.5)
Chloride: 104 mEq/L (ref 96–112)
Creatinine, Ser: 0.6 mg/dL (ref 0.40–1.20)
GFR: 134.82 mL/min (ref >60.00)
Glucose, Bld: 106 mg/dL — ABNORMAL HIGH (ref 70–99)
Potassium: 3.9 mEq/L (ref 3.5–5.1)
Sodium: 137 mEq/L (ref 135–145)
Total Bilirubin: 0.6 mg/dL (ref 0.2–1.2)
Total Protein: 7.1 g/dL (ref 6.0–8.3)

## 2020-04-10 MED ORDER — NORETHINDRONE ACET-ETHINYL EST 1-20 MG-MCG PO TABS: 1.0000 | ORAL_TABLET | Freq: Every day | ORAL | 0 refills | Status: DC

## 2020-04-10 NOTE — Patient Instructions (Addendum)
Please go to the lab for lipid and chemistry/liver check on Crestor to see how you are doing.   I have refilled your birth control.   See low glycemic index diet information sheet.   Follow-up office visit annually or as needed.

## 2020-04-10 NOTE — Progress Notes (Signed)
Established Patient Office Visit  Subjective:  Patient ID: Madeline Moore, female    DOB: 1980-09-30  Age: 39 y.o. MRN: [id number]  CC:  Chief Complaint  Patient presents with  . Transitions Of Care    discuss BC     HPI Madeline Moore is a 39 year old patient who would like to establish care with a new primary care provider. She last saw Lauren in October.  She would like routine labs done and recheck on her lipids. She is also requesting refills of her Microgestin 1-20 mg-MCG tablet birth control pill.  She is up-to-date on her annual CPE with Pap test and breast exam performed 02/07/2020 by her GYN.  She underwent a pelvic ultrasound on 02/13/2020 for history of anemia and uterine fibroids and she wanted to know if they had changed over time. The US showed  uterine fibroids x3. She did have a history of breakthrough bleeding that has stopped. She has no specific complaints today.  HLD: . She presents on Crestor 10 mg daily for elevated lipids and would like that rechecked today. She has no  ASCVD risk enhancing conditions such as advanced age,  diabetes mellitus, hypertension, chronic kidney disease, CHF, or smoking history.  BMI 32.24/weight 182/obesity: She is working on weight loss, healthy diet and exercise.   Past Medical History:  Diagnosis Date  . Anemia   . Fibroid     Past Surgical History:  Procedure Laterality Date  . CESAREAN SECTION      Family History  Problem Relation Age of Onset  . Diabetes Mother   . Diabetes Sister   . Hypertension Father   . Rheum arthritis Father   . Multiple sclerosis Father     Social History   Socioeconomic History  . Marital status: Married    Spouse name: Not on file  . Number of children: Not on file  . Years of education: Not on file  . Highest education level: Not on file  Occupational History  . Not on file  Tobacco Use  . Smoking status: Never Smoker  . Smokeless tobacco: Never Used  Vaping Use  . Vaping  Use: Never used  Substance and Sexual Activity  . Alcohol use: Yes    Comment: occas  . Drug use: No  . Sexual activity: Yes    Birth control/protection: Pill  Other Topics Concern  . Not on file  Social History Narrative  . Not on file   Social Determinants of Health   Financial Resource Strain:   . Difficulty of Paying Living Expenses:   Food Insecurity:   . Worried About Charity fundraiser in the Last Year:   . Arboriculturist in the Last Year:   Transportation Needs:   . Film/video editor (Medical):   Marland Kitchen Lack of Transportation (Non-Medical):   Physical Activity:   . Days of Exercise per Week:   . Minutes of Exercise per Session:   Stress:   . Feeling of Stress :   Social Connections:   . Frequency of Communication with Friends and Family:   . Frequency of Social Gatherings with Friends and Family:   . Attends Religious Services:   . Active Member of Clubs or Organizations:   . Attends Archivist Meetings:   Marland Kitchen Marital Status:   Intimate Partner Violence:   . Fear of Current or Ex-Partner:   . Emotionally Abused:   Marland Kitchen Physically Abused:   . Sexually Abused:  Outpatient Medications Prior to Visit  Medication Sig Dispense Refill  . rosuvastatin (CRESTOR) 10 MG tablet Take 1 tablet (10 mg total) by mouth daily. 90 tablet 3  . norethindrone-ethinyl estradiol (MICROGESTIN) 1-20 MG-MCG tablet Take 1 tablet by mouth daily. 30 tablet 0  . cyclobenzaprine (FLEXERIL) 5 MG tablet Take 1 tablet (5 mg total) by mouth 3 (three) times daily as needed for muscle spasms. (Patient not taking: Reported on 02/07/2020) 30 tablet 1   No facility-administered medications prior to visit.    Allergies  Allergen Reactions  . Doxycycline Nausea And Vomiting   Review of Systems  Constitutional: Negative for chills and fever.  HENT: Negative.   Eyes: Negative.   Respiratory: Negative for cough and shortness of breath.   Cardiovascular: Negative for chest pain,  palpitations and leg swelling.  Gastrointestinal: Negative.   Endocrine: Negative for cold intolerance and heat intolerance.       Negative findings for thyroid disease per ENDOCRINOLOGY consult in 09/2019  Genitourinary: Negative for difficulty urinating, dysuria, frequency, hematuria, menstrual problem, pelvic pain, vaginal bleeding, vaginal discharge and vaginal pain.       Uterine fibroids  Musculoskeletal: Negative.   Skin: Negative.   Allergic/Immunologic: Negative.   Neurological: Negative.   Hematological: Negative.       Objective:    Physical Exam Vitals reviewed.  Constitutional:      Appearance: Normal appearance.  HENT:     Head: Normocephalic.  Eyes:     Conjunctiva/sclera: Conjunctivae normal.     Pupils: Pupils are equal, round, and reactive to light.  Cardiovascular:     Rate and Rhythm: Normal rate and regular rhythm.     Pulses: Normal pulses.     Heart sounds: Normal heart sounds.  Pulmonary:     Effort: Pulmonary effort is normal.     Breath sounds: Normal breath sounds.  Abdominal:     Palpations: Abdomen is soft.     Tenderness: There is no abdominal tenderness.  Musculoskeletal:        General: Normal range of motion.     Cervical back: Normal range of motion and neck supple.  Skin:    General: Skin is warm and dry.  Neurological:     General: No focal deficit present.     Mental Status: She is alert and oriented to person, place, and time.  Psychiatric:        Mood and Affect: Mood normal.        Behavior: Behavior normal.     Comments: Screening PHQ-9: 0 and GAD-7: 0.  Patient has no concerns with depression anxiety     BP 104/78 (BP Location: Left Arm, Patient Position: Sitting, Cuff Size: Normal)   Pulse 94   Temp 97.9 F (36.6 C) (Oral)   Ht '5\' 3"'$  (1.6 m)   Wt 182 lb (82.6 kg)   SpO2 97%   BMI 32.24 kg/m  Wt Readings from Last 3 Encounters:  04/10/20 182 lb (82.6 kg)  02/07/20 183 lb (83 kg)  09/27/19 186 lb 6.4 oz (84.6 kg)      Health Maintenance Due  Topic Date Due  . Hepatitis C Screening  Never done  . TETANUS/TDAP  Never done    There are no preventive care reminders to display for this patient.  Lab Results  Component Value Date   TSH 1.85 09/27/2019   Lab Results  Component Value Date   WBC 7.1 05/18/2019   HGB 12.8 05/18/2019   HCT  40.4 05/18/2019   MCV 76.1 (L) 05/18/2019   PLT 262.0 05/18/2019   Lab Results  Component Value Date   NA 138 05/18/2019   K 3.4 (L) 05/18/2019   CO2 27 05/18/2019   GLUCOSE 93 05/18/2019   BUN 10 05/18/2019   CREATININE 0.66 05/18/2019   BILITOT 0.4 05/18/2019   ALKPHOS 76 05/18/2019   AST 13 05/18/2019   ALT 14 05/18/2019   PROT 7.5 05/18/2019   ALBUMIN 4.1 05/18/2019   CALCIUM 9.5 05/18/2019   GFR 121.35 05/18/2019   Lab Results  Component Value Date   CHOL 263 (H) 05/18/2019   Lab Results  Component Value Date   HDL 49.40 05/18/2019   Lab Results  Component Value Date   LDLCALC 178 (H) 05/18/2019   Lab Results  Component Value Date   TRIG 174.0 (H) 05/18/2019   Lab Results  Component Value Date   CHOLHDL 5 05/18/2019   Lab Results  Component Value Date   HGBA1C 5.6 05/18/2019      Assessment & Plan:   Problem List Items Addressed This Visit      Other   Hyperlipidemia   Relevant Orders   Lipid Profile   Comp Met (CMET)   Birth control counseling - Primary   Relevant Medications   norethindrone-ethinyl estradiol (MICROGESTIN) 1-20 MG-MCG tablet   Obesity (BMI 30.0-34.9)    Other Visit Diagnoses    History of irregular menstrual bleeding       Relevant Orders   CBC with Differential/Platelet      Meds ordered this encounter  Medications  . norethindrone-ethinyl estradiol (MICROGESTIN) 1-20 MG-MCG tablet    Sig: Take 1 tablet by mouth daily.    Dispense:  30 tablet    Refill:  0    Order Specific Question:   Supervising Provider    Answer:   Einar Pheasant [[id number]]   Please go to the lab for lipid and  chemistry/liver check on Crestor to see how you are doing.   I have refilled your birth control.  Patient advised to let me know if she has breakthrough bleedings or any problems with the birth control pill.  We discussed healthy weight and given  low glycemic index diet information sheet.   Follow-up office visit annually or as needed.  Follow-up: No follow-ups on file.  This visit occurred during the SARS-CoV-2 public health emergency.  Safety protocols were in place, including screening questions prior to the visit, additional usage of staff PPE, and extensive cleaning of exam room while observing appropriate contact time as indicated for disinfecting solutions.    Denice Paradise, NP

## 2020-04-13 ENCOUNTER — Encounter: Payer: Self-pay | Admitting: Nurse Practitioner

## 2020-04-18 ENCOUNTER — Ambulatory Visit: Payer: 59 | Attending: Internal Medicine

## 2020-04-18 DIAGNOSIS — Z23 Encounter for immunization: Secondary | ICD-10-CM

## 2020-04-18 NOTE — Progress Notes (Signed)
   Covid-[facility] Vaccination [facility]  Name:  HENESSY ROHRER    MRN: [id number] DOB: Nov 16, 1980  04/18/2020  Ms. Tolosa was observed post Covid-19 immunization for 15 minutes without incident. She was provided with Vaccine Information Sheet and instruction to access the V-Safe system.   Ms. Rabold was instructed to call 911 with any severe reactions post vaccine: Marland Kitchen Difficulty breathing  . Swelling of face and throat  . A fast heartbeat  . A bad rash all over body  . Dizziness and weakness   Immunizations Administered    Name Date Dose VIS Date Route   Pfizer COVID-19 Vaccine 04/18/2020  9:05 AM 0.3 mL 11/14/2018 Intramuscular   Manufacturer: [place]   Lot: [id number]   [place]: [address]-8

## 2020-05-02 ENCOUNTER — Telehealth: Payer: Self-pay | Admitting: Family Medicine

## 2020-05-02 DIAGNOSIS — Z3009 Encounter for other general counseling and advice on contraception: Secondary | ICD-10-CM

## 2020-05-02 MED ORDER — NORETHINDRONE ACET-ETHINYL EST 1-20 MG-MCG PO TABS: 1.0000 | ORAL_TABLET | Freq: Every day | ORAL | 0 refills | Status: DC

## 2020-05-02 MED ORDER — NORETHINDRONE ACET-ETHINYL EST 1-20 MG-MCG PO TABS: 1.0000 | ORAL_TABLET | Freq: Every day | ORAL | 12 refills | Status: DC

## 2020-05-02 NOTE — Telephone Encounter (Signed)
Pt called in need refill on norethindrone-ethinyl estradiol (MICROGESTIN) 1-20 MG-MCG tablet

## 2020-05-02 NOTE — Addendum Note (Signed)
Addended by: Elpidio Galea T on: 05/02/2020 12:53 PM   Modules accepted: Orders

## 2020-05-02 NOTE — Telephone Encounter (Signed)
Pt wants a refill for a year on the Microgestin

## 2020-05-22 NOTE — Telephone Encounter (Signed)
Spoke with patient about her [facility] and advised her refills were sent in on 05/02/20

## 2020-05-22 NOTE — Telephone Encounter (Signed)
Patient called in about her Microgestin

## 2020-07-31 DIAGNOSIS — T490X5A Adverse effect of local antifungal, anti-infective and anti-inflammatory drugs, initial encounter: Secondary | ICD-10-CM | POA: Diagnosis not present

## 2020-07-31 DIAGNOSIS — L218 Other seborrheic dermatitis: Secondary | ICD-10-CM | POA: Diagnosis not present

## 2020-08-07 IMAGING — CR CHEST - 2 VIEW
1 series · 2 of 2 positions shown · non-contrast
Comparison: None.

CLINICAL DATA: Productive cough and chest congestion for the past 3
weeks.

EXAM:
CHEST - 2 VIEW

[Series 1: dg chest 2 view · 0.14mm/px · 2 of 2 slices shown]
[im 1/2]
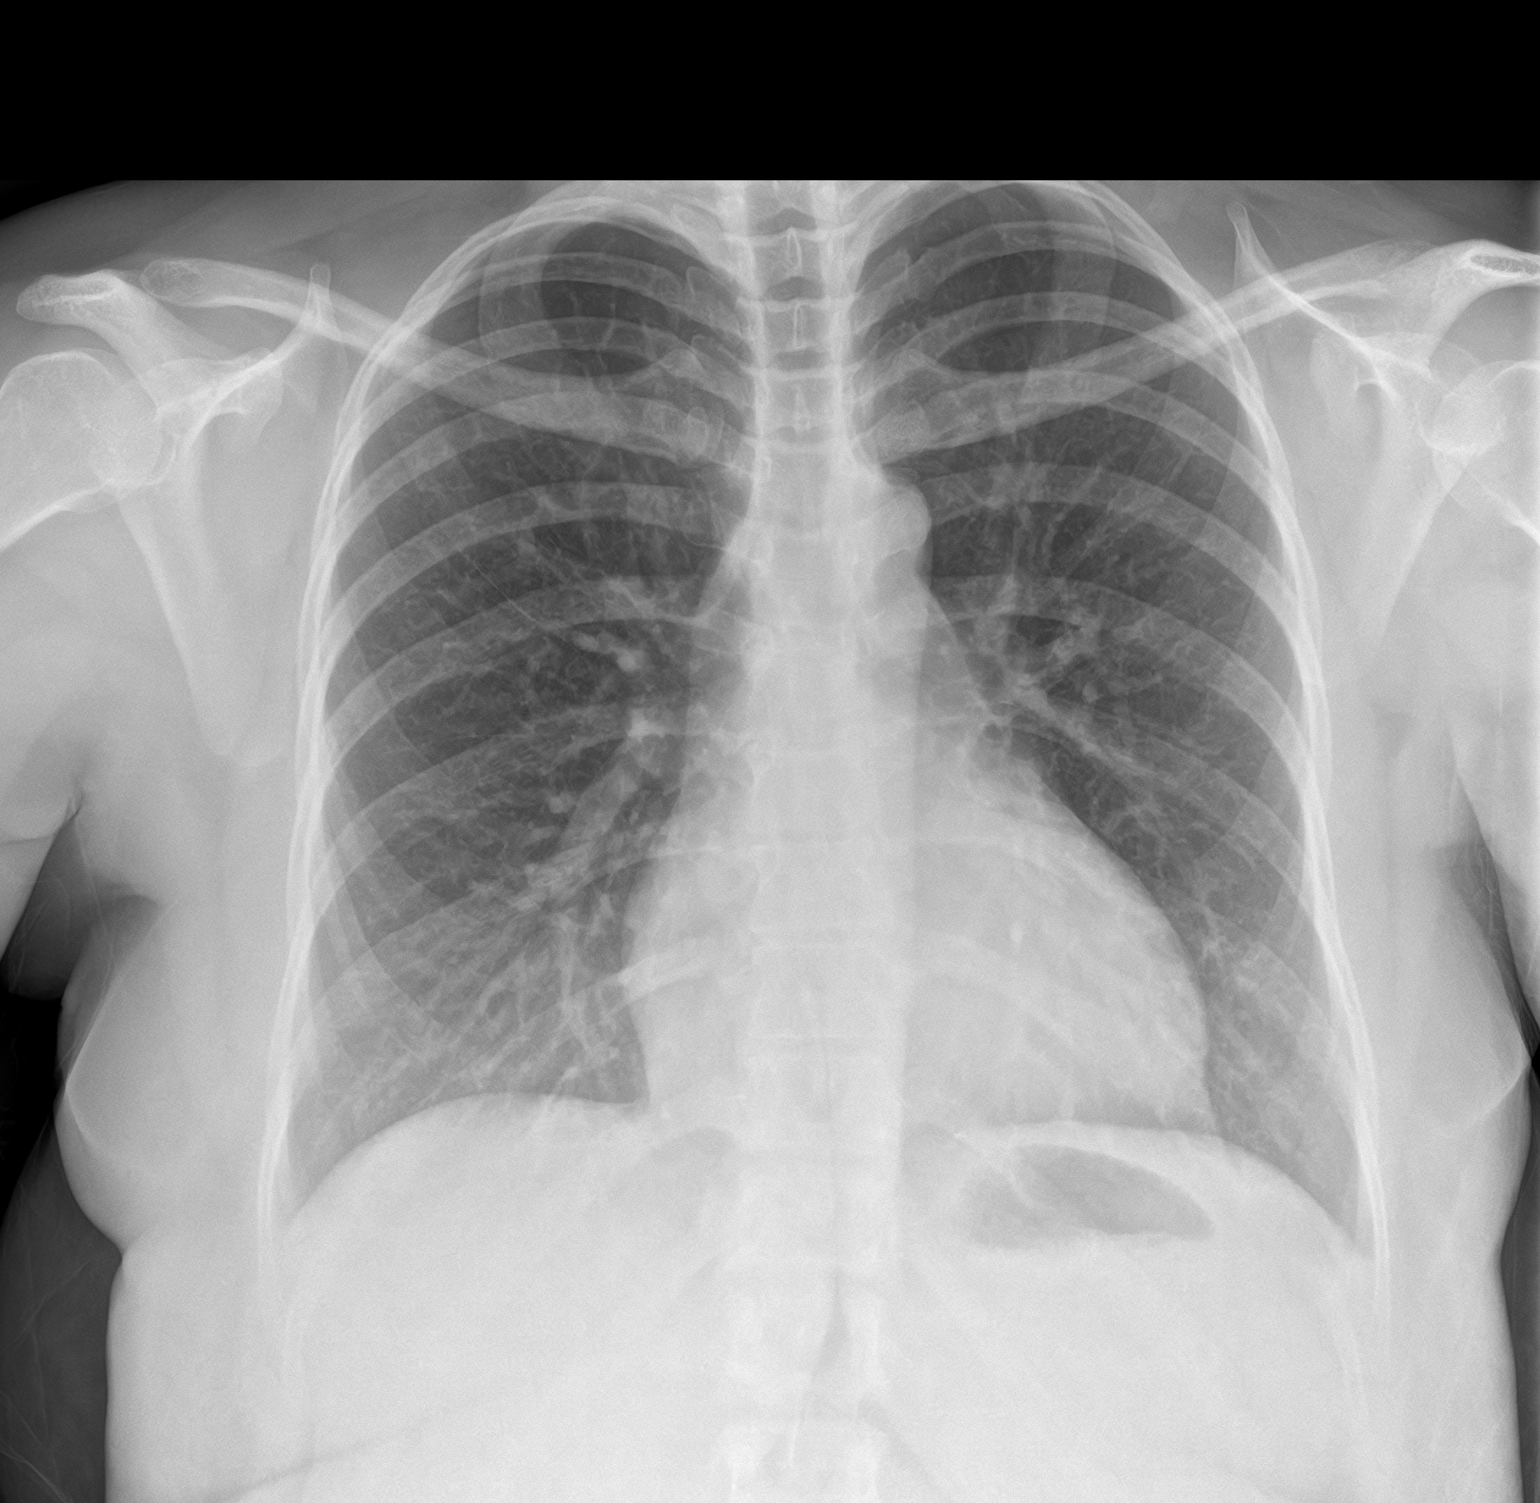
[im 2/2]
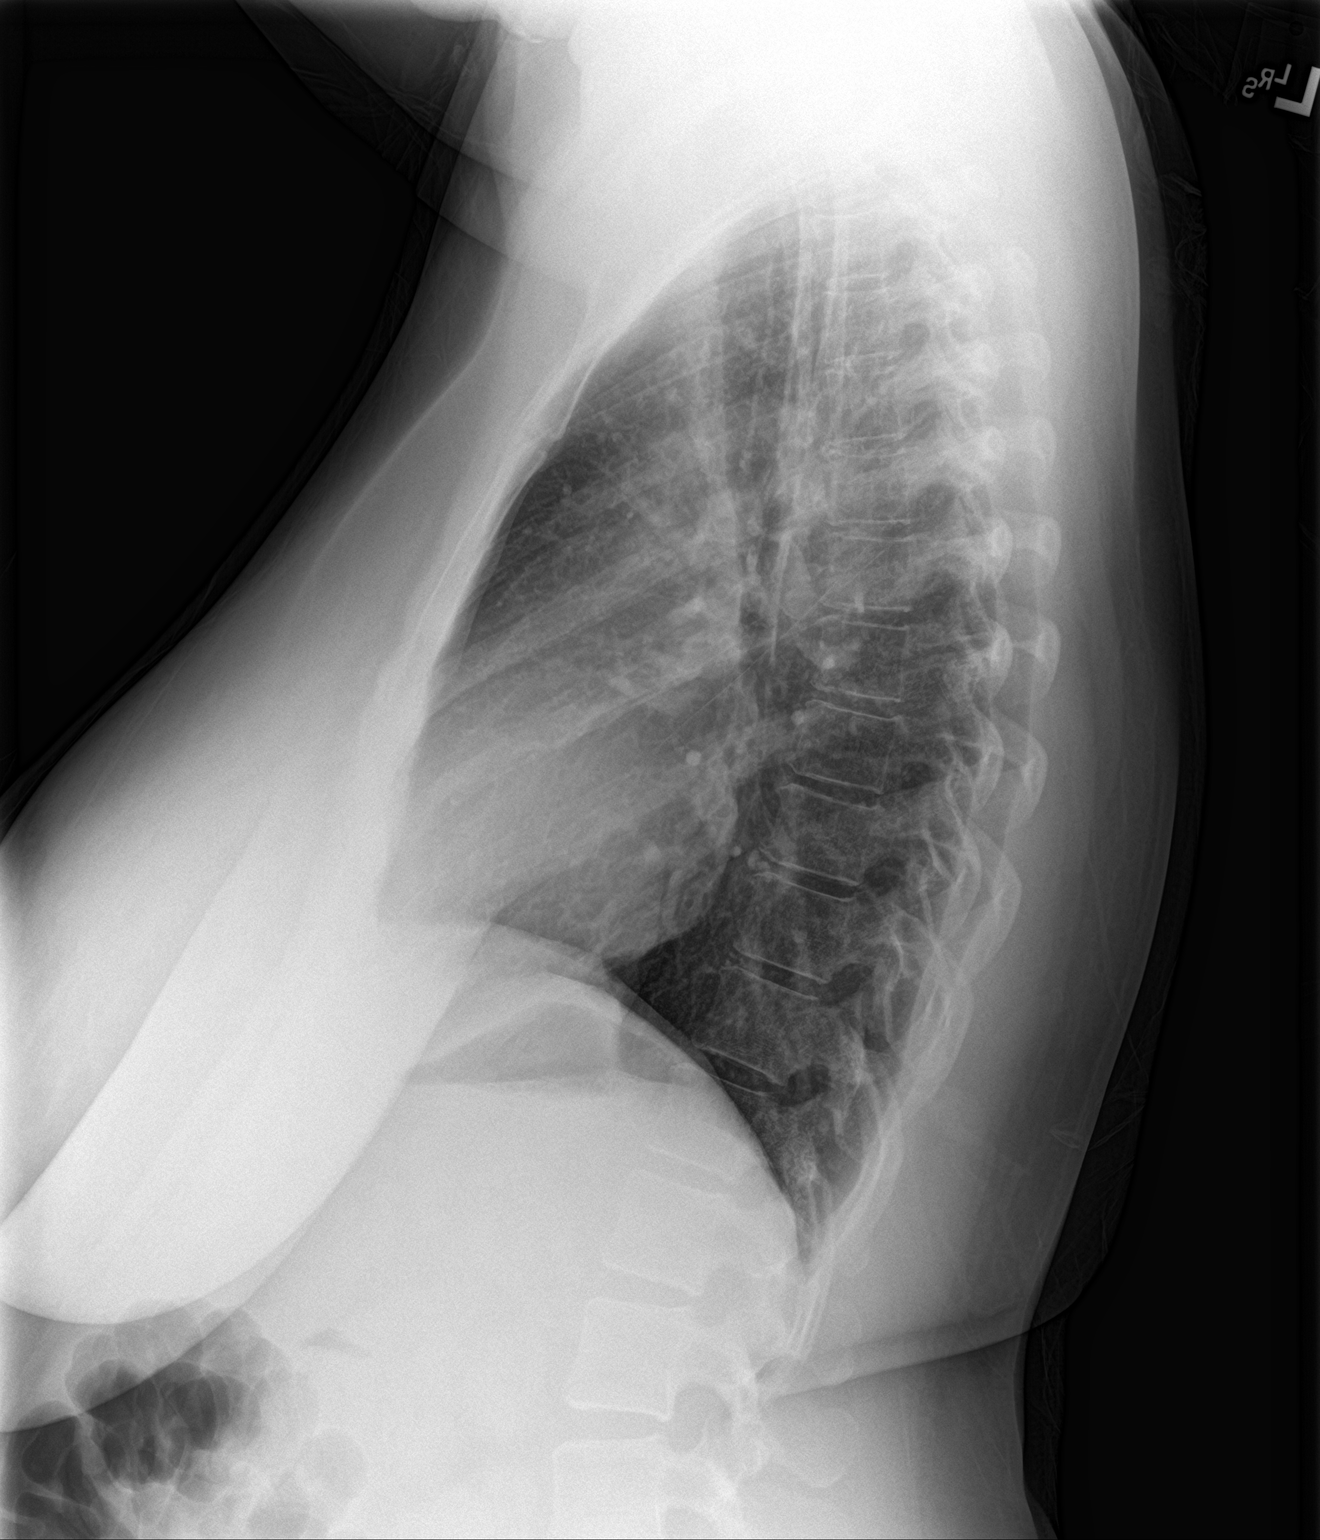

[2 of 2 positions shown; findings below may reference images not displayed]

FINDINGS: Normal sized heart. Clear lungs. Minimal thoracic spine degenerative
changes.
IMPRESSION: No acute abnormality.

## 2020-11-05 DIAGNOSIS — L670 Trichorrhexis nodosa: Secondary | ICD-10-CM | POA: Diagnosis not present

## 2020-11-05 DIAGNOSIS — L02821 Furuncle of head [any part, except face]: Secondary | ICD-10-CM | POA: Diagnosis not present

## 2020-11-27 DIAGNOSIS — H43813 Vitreous degeneration, bilateral: Secondary | ICD-10-CM | POA: Diagnosis not present

## 2021-02-25 ENCOUNTER — Other Ambulatory Visit: Payer: 59

## 2021-02-25 ENCOUNTER — Telehealth: Payer: Self-pay

## 2021-02-25 ENCOUNTER — Telehealth: Payer: Self-pay | Admitting: Nurse Practitioner

## 2021-02-25 DIAGNOSIS — Z3009 Encounter for other general counseling and advice on contraception: Secondary | ICD-10-CM

## 2021-02-25 NOTE — Telephone Encounter (Signed)
PT called to request a refill of their norethindrone-ethinyl estradiol (MICROGESTIN) 1-20 MG-MCG tablet. PT has apt with michelle flinchum on Sept 1st at 2:00.

## 2021-02-25 NOTE — Telephone Encounter (Signed)
Called and spoke to [name] and scheduled her for a Urine Drop off Tomorrow at 10:30 for a Urine Pregnancy test. Patient requested to come in earlier for the Urine Drop Off. Informed patient that if she comes in earlier, she may be held up as the lab goes off of appointment times. Madeline Moore verbalized understanding and had no further questions.

## 2021-02-25 NOTE — Telephone Encounter (Signed)
Patient has been called and POCT Urine Pregnancy has been ordered.

## 2021-02-25 NOTE — Telephone Encounter (Signed)
Can do lab HCG pregnancy and after results will refill OCP.

## 2021-02-26 ENCOUNTER — Encounter: Payer: Self-pay | Admitting: Adult Health

## 2021-02-26 ENCOUNTER — Other Ambulatory Visit: Payer: Self-pay

## 2021-02-26 ENCOUNTER — Other Ambulatory Visit (INDEPENDENT_AMBULATORY_CARE_PROVIDER_SITE_OTHER): Payer: 59

## 2021-02-26 ENCOUNTER — Other Ambulatory Visit: Payer: Self-pay | Admitting: Adult Health

## 2021-02-26 DIAGNOSIS — Z3009 Encounter for other general counseling and advice on contraception: Secondary | ICD-10-CM

## 2021-02-26 LAB — POCT URINE PREGNANCY: Preg Test, Ur: NEGATIVE

## 2021-02-26 MED ORDER — NORETHINDRONE ACET-ETHINYL EST 1-20 MG-MCG PO TABS: 1.0000 | ORAL_TABLET | Freq: Every day | ORAL | 3 refills | Status: DC

## 2021-02-26 NOTE — Telephone Encounter (Signed)
PT came into the office for Labs at 10:15 and dropped off the label for the prescription she needs a refill off. It is Microgestin 1/20 Tabs 21S yellow.

## 2021-02-26 NOTE — Telephone Encounter (Signed)
PT called to advise that the refill was suppose to be the norethindrone-ethinyl estradiol (MICROGESTIN) 1-20 MG-MCG tablet not the Microgestin as she is used to taking the Norethindrone. Would like for it to be corrected and called in.

## 2021-02-26 NOTE — Progress Notes (Signed)
Negative pregnancy test, I did refill her OCP as requested until her Transfer of Care appointment. Be seen sooner if any concerns.

## 2021-02-27 NOTE — Telephone Encounter (Signed)
Called and spoke to [place]. Sandria states that she spoke to the Pharmacy and was told that the medication she requested and what was prescribed was the same medicine, just under a different name. Monet had no other concerns or questions.

## 2021-05-14 ENCOUNTER — Telehealth: Payer: Self-pay | Admitting: Nurse Practitioner

## 2021-05-14 NOTE — Telephone Encounter (Signed)
Patient is calling to see if she had her t-dap and if her immunization record is in the system.Please call her at [phone].

## 2021-05-14 NOTE — Telephone Encounter (Signed)
Noted  

## 2021-05-14 NOTE — Telephone Encounter (Signed)
Patient calling back in and informed that only the PPD and Covid vaccines are on file.   Checked [facility] and there are no other vaccines on file.   Patient is former [employer] Patient and is moving so will not be establishing with new provider in our office.   Patient states she needs the TDAP for her job. Informed that this can be done at the health department. Patient will also call urgent care to see if they will be able to do so as well.

## 2021-05-20 ENCOUNTER — Telehealth: Payer: Self-pay | Admitting: Obstetrics and Gynecology

## 2021-05-20 ENCOUNTER — Telehealth: Payer: Self-pay | Admitting: Nurse Practitioner

## 2021-05-20 DIAGNOSIS — Z3009 Encounter for other general counseling and advice on contraception: Secondary | ICD-10-CM

## 2021-05-20 MED ORDER — NORETHINDRONE ACET-ETHINYL EST 1-20 MG-MCG PO TABS: 1.0000 | ORAL_TABLET | Freq: Every day | ORAL | 3 refills | Status: AC

## 2021-05-20 NOTE — Telephone Encounter (Signed)
Medication has been refilled for 3 months

## 2021-05-20 NOTE — Telephone Encounter (Signed)
Pt called today asking for urgent appointment she is about to run out of her birth control, made pt aware that her last physical was 05 of 2021- she would need an apt in order to get refill- scheduled Dr.Evans next available physical which is 10-03. Pt is asking for a refill to get her to her appointment. Dr.Evans last time rx the bc  norethindrone- estradiol (Microgestin) was in 2018. Please Advise.

## 2021-05-20 NOTE — Addendum Note (Signed)
Addended by: Crecencio Mc on: 05/20/2021 05:40 PM   Modules accepted: Orders

## 2021-05-20 NOTE — Telephone Encounter (Signed)
Patient needs a refill on her norethindrone-ethinyl estradiol (MICROGESTIN) 1-20 MG-MCG tablet. Patient has rescheduled her TOC with Flinchum for 07/09/2021.

## 2021-05-21 ENCOUNTER — Encounter: Payer: 59 | Admitting: Adult Health

## 2021-05-21 NOTE — Telephone Encounter (Signed)
Patient calling back in and informed

## 2021-05-22 NOTE — Telephone Encounter (Signed)
Pt aware.   Pt asked that I cx her AE appt. She got in with her other DR.

## 2021-06-22 ENCOUNTER — Encounter: Payer: 59 | Admitting: Obstetrics and Gynecology

## 2021-07-09 ENCOUNTER — Encounter: Payer: 59 | Admitting: Adult Health
